# Patient Record
Sex: Female | Born: 1970 | Race: White | Hispanic: No | Marital: Married | Smoking: Former smoker
Health system: Southern US, Community
[De-identification: ages and names within clinical notes are randomized; demographics above are authoritative.]

## PROBLEM LIST (undated history)

## (undated) DIAGNOSIS — D649 Anemia, unspecified: Secondary | ICD-10-CM

## (undated) DIAGNOSIS — R000 Tachycardia, unspecified: Secondary | ICD-10-CM

## (undated) DIAGNOSIS — Z87898 Personal history of other specified conditions: Secondary | ICD-10-CM

## (undated) DIAGNOSIS — Z8739 Personal history of other diseases of the musculoskeletal system and connective tissue: Secondary | ICD-10-CM

## (undated) DIAGNOSIS — L900 Lichen sclerosus et atrophicus: Secondary | ICD-10-CM

## (undated) DIAGNOSIS — G243 Spasmodic torticollis: Secondary | ICD-10-CM

## (undated) DIAGNOSIS — Z8041 Family history of malignant neoplasm of ovary: Secondary | ICD-10-CM

## (undated) DIAGNOSIS — K5792 Diverticulitis of intestine, part unspecified, without perforation or abscess without bleeding: Secondary | ICD-10-CM

## (undated) DIAGNOSIS — M436 Torticollis: Secondary | ICD-10-CM

## (undated) DIAGNOSIS — R87810 Cervical high risk human papillomavirus (HPV) DNA test positive: Secondary | ICD-10-CM

## (undated) DIAGNOSIS — I10 Essential (primary) hypertension: Secondary | ICD-10-CM

## (undated) HISTORY — DX: Spasmodic torticollis: G24.3

## (undated) HISTORY — DX: Personal history of other diseases of the musculoskeletal system and connective tissue: Z87.39

## (undated) HISTORY — DX: Personal history of other specified conditions: Z87.898

## (undated) HISTORY — DX: Lichen sclerosus et atrophicus: L90.0

## (undated) HISTORY — DX: Diverticulitis of intestine, part unspecified, without perforation or abscess without bleeding: K57.92

## (undated) HISTORY — DX: Family history of malignant neoplasm of ovary: Z80.41

## (undated) HISTORY — DX: Torticollis: M43.6

## (undated) HISTORY — DX: Cervical high risk human papillomavirus (HPV) DNA test positive: R87.810

## (undated) HISTORY — DX: Tachycardia, unspecified: R00.0

## (undated) HISTORY — DX: Anemia, unspecified: D64.9

## (undated) HISTORY — PX: TONSILLECTOMY: SUR1361

---

## 1996-12-11 HISTORY — PX: CERVICAL BIOPSY  W/ LOOP ELECTRODE EXCISION: SUR135

## 1997-06-18 HISTORY — PX: VULVA /PERINEUM BIOPSY: SHX319

## 2001-06-18 DIAGNOSIS — R87810 Cervical high risk human papillomavirus (HPV) DNA test positive: Secondary | ICD-10-CM

## 2001-06-18 HISTORY — DX: Cervical high risk human papillomavirus (HPV) DNA test positive: R87.810

## 2002-06-18 HISTORY — PX: CHOLECYSTECTOMY: SHX55

## 2004-10-11 ENCOUNTER — Ambulatory Visit: Payer: Self-pay | Admitting: Unknown Physician Specialty

## 2004-11-10 ENCOUNTER — Ambulatory Visit: Payer: Self-pay | Admitting: Emergency Medicine

## 2004-11-10 ENCOUNTER — Emergency Department: Payer: Self-pay | Admitting: Emergency Medicine

## 2005-06-18 HISTORY — PX: COLONOSCOPY: SHX5424

## 2007-01-14 ENCOUNTER — Emergency Department: Payer: Self-pay

## 2007-01-29 ENCOUNTER — Ambulatory Visit: Payer: Self-pay | Admitting: Unknown Physician Specialty

## 2007-02-14 ENCOUNTER — Ambulatory Visit: Payer: Self-pay | Admitting: Unknown Physician Specialty

## 2007-02-26 ENCOUNTER — Ambulatory Visit: Payer: Self-pay | Admitting: Unknown Physician Specialty

## 2007-03-20 ENCOUNTER — Ambulatory Visit: Payer: Self-pay | Admitting: Unknown Physician Specialty

## 2009-03-29 ENCOUNTER — Ambulatory Visit: Payer: Self-pay | Admitting: Unknown Physician Specialty

## 2009-03-31 ENCOUNTER — Ambulatory Visit: Payer: Self-pay | Admitting: Unknown Physician Specialty

## 2009-06-18 HISTORY — PX: BREAST BIOPSY: SHX20

## 2009-08-31 ENCOUNTER — Emergency Department: Payer: Self-pay | Admitting: Emergency Medicine

## 2009-10-04 ENCOUNTER — Ambulatory Visit: Payer: Self-pay | Admitting: General Surgery

## 2010-05-08 ENCOUNTER — Ambulatory Visit: Payer: Self-pay | Admitting: General Surgery

## 2011-06-13 ENCOUNTER — Ambulatory Visit: Payer: Self-pay | Admitting: Unknown Physician Specialty

## 2012-06-18 DIAGNOSIS — R000 Tachycardia, unspecified: Secondary | ICD-10-CM

## 2012-06-18 HISTORY — DX: Tachycardia, unspecified: R00.0

## 2013-02-06 ENCOUNTER — Ambulatory Visit: Payer: Self-pay | Admitting: Neurology

## 2013-02-12 ENCOUNTER — Emergency Department: Payer: Self-pay | Admitting: Emergency Medicine

## 2013-02-12 LAB — DRUG SCREEN, URINE

## 2013-02-12 LAB — CBC
HCT: 39.1 % (ref 35.0–47.0)
MCH: 31.4 pg (ref 26.0–34.0)
MCHC: 35 g/dL (ref 32.0–36.0)
Platelet: 198 10*3/uL (ref 150–440)
RBC: 4.36 10*6/uL (ref 3.80–5.20)
RDW: 13.2 % (ref 11.5–14.5)

## 2013-02-12 LAB — URINALYSIS, COMPLETE
Bacteria: NONE SEEN
Bilirubin,UR: NEGATIVE
Glucose,UR: NEGATIVE mg/dL (ref 0–75)
Ketone: NEGATIVE
Leukocyte Esterase: NEGATIVE
Nitrite: NEGATIVE
Ph: 5 (ref 4.5–8.0)
Protein: NEGATIVE
Squamous Epithelial: NONE SEEN
WBC UR: <1 /HPF (ref 0–5)

## 2013-02-12 LAB — TROPONIN I: Troponin-I: <0.02 ng/mL

## 2013-02-12 LAB — COMPREHENSIVE METABOLIC PANEL
Albumin: 3.9 g/dL (ref 3.4–5.0)
Alkaline Phosphatase: 100 U/L (ref 50–136)
Anion Gap: 7 (ref 7–16)
BUN: 10 mg/dL (ref 7–18)
Chloride: 109 mmol/L — ABNORMAL HIGH (ref 98–107)
EGFR (African American): >60
EGFR (Non-African Amer.): >60
Osmolality: 273 (ref 275–301)
SGOT(AST): 21 U/L (ref 15–37)
SGPT (ALT): 18 U/L (ref 12–78)
Total Protein: 6.8 g/dL (ref 6.4–8.2)

## 2013-02-12 LAB — CK TOTAL AND CKMB (NOT AT ARMC): CK-MB: 0.9 ng/mL (ref 0.5–3.6)

## 2013-02-13 LAB — TSH: Thyroid Stimulating Horm: 1.64 u[IU]/mL

## 2013-03-19 ENCOUNTER — Emergency Department: Payer: Self-pay | Admitting: Emergency Medicine

## 2013-03-19 LAB — COMPREHENSIVE METABOLIC PANEL
Albumin: 4.5 g/dL (ref 3.4–5.0)
Alkaline Phosphatase: 108 U/L (ref 50–136)
BUN: 13 mg/dL (ref 7–18)
Calcium, Total: 9.1 mg/dL (ref 8.5–10.1)
Chloride: 104 mmol/L (ref 98–107)
Creatinine: 0.9 mg/dL (ref 0.60–1.30)
EGFR (African American): >60
SGOT(AST): 25 U/L (ref 15–37)
SGPT (ALT): 23 U/L (ref 12–78)
Total Protein: 7.5 g/dL (ref 6.4–8.2)

## 2013-03-19 LAB — CBC WITH DIFFERENTIAL/PLATELET
Basophil #: 0.1 10*3/uL (ref 0.0–0.1)
Basophil %: 1.2 %
Eosinophil #: 0 10*3/uL (ref 0.0–0.7)
HCT: 42.1 % (ref 35.0–47.0)
Monocyte %: 5.1 %
Neutrophil #: 4.2 10*3/uL (ref 1.4–6.5)
Platelet: 225 10*3/uL (ref 150–440)
RBC: 4.69 10*6/uL (ref 3.80–5.20)
WBC: 6.7 10*3/uL (ref 3.6–11.0)

## 2013-03-19 LAB — TROPONIN I: Troponin-I: <0.02 ng/mL

## 2014-05-26 ENCOUNTER — Emergency Department: Payer: Self-pay | Admitting: Emergency Medicine

## 2014-11-12 ENCOUNTER — Other Ambulatory Visit: Payer: Self-pay | Admitting: Orthopedic Surgery

## 2014-11-12 DIAGNOSIS — S63592A Other specified sprain of left wrist, initial encounter: Secondary | ICD-10-CM

## 2014-11-12 DIAGNOSIS — G8929 Other chronic pain: Secondary | ICD-10-CM

## 2014-11-12 DIAGNOSIS — M25532 Pain in left wrist: Secondary | ICD-10-CM

## 2014-11-22 ENCOUNTER — Ambulatory Visit: Payer: Self-pay

## 2014-11-24 ENCOUNTER — Ambulatory Visit
Admission: RE | Admit: 2014-11-24 | Discharge: 2014-11-24 | Disposition: A | Discharge status: Home / Self Care | Payer: BLUE CROSS/BLUE SHIELD | Source: Ambulatory Visit | Attending: Orthopedic Surgery | Admitting: Orthopedic Surgery

## 2014-11-24 DIAGNOSIS — S63592A Other specified sprain of left wrist, initial encounter: Secondary | ICD-10-CM

## 2014-11-24 DIAGNOSIS — M778 Other enthesopathies, not elsewhere classified: Secondary | ICD-10-CM | POA: Diagnosis not present

## 2014-11-24 DIAGNOSIS — M25532 Pain in left wrist: Secondary | ICD-10-CM

## 2014-11-24 DIAGNOSIS — G8929 Other chronic pain: Secondary | ICD-10-CM

## 2015-01-17 DIAGNOSIS — M436 Torticollis: Secondary | ICD-10-CM

## 2015-01-17 HISTORY — DX: Torticollis: M43.6

## 2015-12-17 HISTORY — PX: WRIST SURGERY: SHX841

## 2016-06-05 ENCOUNTER — Other Ambulatory Visit: Payer: Self-pay | Admitting: Certified Nurse Midwife

## 2016-06-05 DIAGNOSIS — Z1231 Encounter for screening mammogram for malignant neoplasm of breast: Secondary | ICD-10-CM

## 2016-06-18 HISTORY — PX: BREAST BIOPSY: SHX20

## 2016-06-22 ENCOUNTER — Encounter (HOSPITAL_COMMUNITY): Payer: Self-pay

## 2016-06-22 ENCOUNTER — Ambulatory Visit
Admission: RE | Admit: 2016-06-22 | Discharge: 2016-06-22 | Disposition: A | Discharge status: Home / Self Care | Payer: BLUE CROSS/BLUE SHIELD | Source: Ambulatory Visit | Attending: Certified Nurse Midwife | Admitting: Certified Nurse Midwife

## 2016-06-22 DIAGNOSIS — Z1231 Encounter for screening mammogram for malignant neoplasm of breast: Secondary | ICD-10-CM | POA: Insufficient documentation

## 2016-06-26 ENCOUNTER — Other Ambulatory Visit: Payer: Self-pay | Admitting: Certified Nurse Midwife

## 2016-06-26 DIAGNOSIS — R928 Other abnormal and inconclusive findings on diagnostic imaging of breast: Secondary | ICD-10-CM

## 2016-06-26 DIAGNOSIS — R921 Mammographic calcification found on diagnostic imaging of breast: Secondary | ICD-10-CM

## 2016-07-11 ENCOUNTER — Ambulatory Visit
Admission: RE | Admit: 2016-07-11 | Discharge: 2016-07-11 | Disposition: A | Discharge status: Home / Self Care | Payer: Self-pay | Source: Ambulatory Visit | Attending: *Deleted | Admitting: *Deleted

## 2016-07-11 ENCOUNTER — Ambulatory Visit
Admission: RE | Admit: 2016-07-11 | Discharge: 2016-07-11 | Disposition: A | Discharge status: Home / Self Care | Payer: BLUE CROSS/BLUE SHIELD | Source: Ambulatory Visit | Attending: Certified Nurse Midwife | Admitting: Certified Nurse Midwife

## 2016-07-11 ENCOUNTER — Other Ambulatory Visit: Payer: Self-pay | Admitting: *Deleted

## 2016-07-11 DIAGNOSIS — Z9289 Personal history of other medical treatment: Secondary | ICD-10-CM

## 2016-07-11 DIAGNOSIS — R928 Other abnormal and inconclusive findings on diagnostic imaging of breast: Secondary | ICD-10-CM

## 2016-07-11 DIAGNOSIS — R921 Mammographic calcification found on diagnostic imaging of breast: Secondary | ICD-10-CM | POA: Diagnosis present

## 2016-07-12 ENCOUNTER — Other Ambulatory Visit: Payer: Self-pay | Admitting: Certified Nurse Midwife

## 2016-07-12 DIAGNOSIS — R928 Other abnormal and inconclusive findings on diagnostic imaging of breast: Secondary | ICD-10-CM

## 2016-07-12 DIAGNOSIS — R921 Mammographic calcification found on diagnostic imaging of breast: Secondary | ICD-10-CM

## 2016-08-02 ENCOUNTER — Ambulatory Visit
Admission: RE | Admit: 2016-08-02 | Discharge: 2016-08-02 | Disposition: A | Discharge status: Home / Self Care | Payer: BLUE CROSS/BLUE SHIELD | Source: Ambulatory Visit | Attending: Certified Nurse Midwife | Admitting: Certified Nurse Midwife

## 2016-08-02 DIAGNOSIS — R921 Mammographic calcification found on diagnostic imaging of breast: Secondary | ICD-10-CM

## 2016-08-02 DIAGNOSIS — N6021 Fibroadenosis of right breast: Secondary | ICD-10-CM | POA: Insufficient documentation

## 2016-08-02 DIAGNOSIS — R928 Other abnormal and inconclusive findings on diagnostic imaging of breast: Secondary | ICD-10-CM

## 2016-08-03 LAB — SURGICAL PATHOLOGY

## 2016-08-07 ENCOUNTER — Encounter: Payer: Self-pay | Admitting: *Deleted

## 2016-08-23 ENCOUNTER — Ambulatory Visit (INDEPENDENT_AMBULATORY_CARE_PROVIDER_SITE_OTHER): Payer: BLUE CROSS/BLUE SHIELD | Admitting: General Surgery

## 2016-08-23 ENCOUNTER — Encounter: Payer: Self-pay | Admitting: General Surgery

## 2016-08-23 VITALS — BP 120/78 | HR 81 | Ht 67.0 in | Wt 151.0 lb

## 2016-08-23 DIAGNOSIS — N6489 Other specified disorders of breast: Secondary | ICD-10-CM | POA: Diagnosis not present

## 2016-08-23 NOTE — Patient Instructions (Addendum)
6 month follow up with right diagnostic mammogram.Take two Aleve twice a day, apply local heat to area.

## 2016-08-23 NOTE — Progress Notes (Signed)
Patient ID: Chelsea Charles, female   DOB: Mar 07, 1971, 46 y.o.   MRN: [id number]  Chief Complaint  Patient presents with  . Other    Complex sclerosis lesion    HPI Chelsea Charles is a 46 y.o. female is here today for an evaluation of a complex sclerosis lesion found on a biopsy done on 08/02/16. Patient had a mammogram done on 07/11/16. Patient reports she does not perform self breast checks as often as she should. She could not feel anything prior to having a mammogram. She does get her yearly mammograms completed. She states she had a biopsy done in 2011 near the same site as previously biopsied. She does report some tenderness in the area. Denies family history of breast cancer.  HPI  Past Medical History:  Diagnosis Date  . Tachycardia 2014  . Torticollis 01/2015    Past Surgical History:  Procedure Laterality Date  . BREAST BIOPSY Right 2011   stereo- neg-by Dr. Lemar Livings  . CESAREAN SECTION  2004  . CESAREAN SECTION  2002  . COLONOSCOPY  2007  . LEEP  1998  . WRIST SURGERY  12/2015    Family History  Problem Relation Age of Onset  . Breast cancer Cousin   . Cancer Maternal Grandmother     ovarian    Social History Social History  Substance Use Topics  . Smoking status: Former Smoker    Types: Cigarettes    Quit date: 06/19/1995  . Smokeless tobacco: Never Used  . Alcohol use Yes     Comment: occasionally    Allergies  Allergen Reactions  . Sulfa Antibiotics Shortness Of Breath  . Codeine Nausea And Vomiting  . Penicillins Swelling    Current Outpatient Prescriptions  Medication Sig Dispense Refill  . Ascorbic Acid (VITAMIN C) 1000 MG tablet Take 1,000 mg by mouth daily.    . fluticasone (FLONASE) 50 MCG/ACT nasal spray Place into the nose.    . ibuprofen (ADVIL,MOTRIN) 200 MG tablet Take by mouth.    . LO LOESTRIN FE 1 MG-10 MCG / 10 MCG tablet     . loratadine (CLARITIN) 10 MG tablet Take 10 mg by mouth daily.    . Multiple Vitamins-Minerals  (MULTIVITAMIN GUMMIES WOMENS PO) Take by mouth.     No current facility-administered medications for this visit.     Review of Systems Review of Systems  Constitutional: Negative.   Respiratory: Negative.   Cardiovascular: Negative.     Blood pressure 120/78, pulse 81, height 5\' 7"  (1.702 m), weight 151 lb (68.5 kg).  Physical Exam Physical Exam  Constitutional: She is oriented to person, place, and time. She appears well-developed and well-nourished.  Eyes: Conjunctivae are normal. No scleral icterus.  Neck: Neck supple. No thyromegaly present.  Cardiovascular: Normal rate, regular rhythm and normal heart sounds.   Pulmonary/Chest: Effort normal and breath sounds normal. Right breast exhibits no inverted nipple, no mass, no nipple discharge, no skin change and no tenderness. Left breast exhibits no inverted nipple, no mass, no nipple discharge, no skin change and no tenderness.  Minimal bruising from biopsy   Lymphadenopathy:    She has no cervical adenopathy.    She has no axillary adenopathy.  Neurological: She is alert and oriented to person, place, and time.  Skin: Skin is warm and dry.    Data Reviewed 2015 in 2018 mammograms were reviewed including both pre-and post biopsy imaging of the right breast. Extensive calcifications noted on both sets of films.  Essentially resolved post biopsy.  August 02, 2016 biopsy result:  A. BREAST CALCIFICATIONS, RIGHT UPPER OUTER QUADRANT; STEREOTACTIC  BIOPSY:  - COMPLEX SCLEROSING LESION.  - COLUMNAR CELL CHANGE.  - PSEUDO-ANGIOMATOUS STROMAL HYPERPLASIA.  - SCLEROSING ADENOSIS.  - CYST FORMATION.  - CALCIFICATIONS ASSOCIATED WITH ABOVE FINDINGS.  - NEGATIVE FOR ATYPIA AND MALIGNANCY.   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    Plan    A recent article from the age [facility] [employer] volume to 23, number 04/23/2015 describes  it radial scar does not require additional surgery and less atypia is indicated.  Position statement of the American Society [employer] Surgeons describes decision making on a case-by-case basis but leans towards excision.   Based on review of the pre-and post biopsy imaging as well as the 2015 films I believe that observation alone is warranted. Opportunity for biopsy if she be more comfortable was discussed.     At this time, the patient is comfortable with observation. Due to the residual soreness postbiopsy she's been encouraged to make use of nonsteroidals.  Take two Aleve twice a day, apply local heat to area.  This information has been scribed by Milas Kocherebeca Morris, CMA     Earline MayotteByrnett, Montel Vanderhoof W 08/24/2016, 7:02 AM

## 2016-08-24 DIAGNOSIS — N6489 Other specified disorders of breast: Secondary | ICD-10-CM | POA: Insufficient documentation

## 2016-11-28 ENCOUNTER — Other Ambulatory Visit: Payer: Self-pay

## 2016-11-28 DIAGNOSIS — N6489 Other specified disorders of breast: Secondary | ICD-10-CM

## 2017-01-31 ENCOUNTER — Other Ambulatory Visit: Payer: BLUE CROSS/BLUE SHIELD

## 2017-02-01 ENCOUNTER — Ambulatory Visit
Admission: RE | Admit: 2017-02-01 | Discharge: 2017-02-01 | Disposition: A | Discharge status: Home / Self Care | Payer: BLUE CROSS/BLUE SHIELD | Source: Ambulatory Visit | Attending: General Surgery | Admitting: General Surgery

## 2017-02-01 DIAGNOSIS — N6489 Other specified disorders of breast: Secondary | ICD-10-CM

## 2017-02-05 ENCOUNTER — Ambulatory Visit: Payer: BLUE CROSS/BLUE SHIELD | Admitting: General Surgery

## 2017-02-07 ENCOUNTER — Ambulatory Visit (INDEPENDENT_AMBULATORY_CARE_PROVIDER_SITE_OTHER): Payer: BLUE CROSS/BLUE SHIELD | Admitting: General Surgery

## 2017-02-07 ENCOUNTER — Encounter: Payer: Self-pay | Admitting: General Surgery

## 2017-02-07 VITALS — BP 132/72 | HR 74 | Resp 12 | Ht 63.0 in | Wt 169.0 lb

## 2017-02-07 DIAGNOSIS — N6489 Other specified disorders of breast: Secondary | ICD-10-CM | POA: Diagnosis not present

## 2017-02-07 NOTE — Progress Notes (Signed)
Patient ID: Chelsea Charles, female   DOB: [birth year], 46 y.o.   MRN: [id number]  Chief Complaint  Patient presents with  . Follow-up    HPI Chelsea Charles is a 46 y.o. female who presents for a breast evaluation. The most recent right breast mammogram was done on 02/01/2017.  Marland Kitchen  Patient does perform regular self breast checks and gets regular mammograms done.    HPI  Past Medical History:  Diagnosis Date  . Tachycardia 2014  . Torticollis 01/2015    Past Surgical History:  Procedure Laterality Date  . BREAST BIOPSY Right 2011   stereo- neg-by Dr. Lemar Livings  . CESAREAN SECTION  2004  . CESAREAN SECTION  2002  . COLONOSCOPY  2007  . LEEP  1998  . WRIST SURGERY  12/2015    Family History  Problem Relation Age of Onset  . Breast cancer Cousin   . Cancer Maternal Grandmother        ovarian    Social History Social History  Substance Use Topics  . Smoking status: Former Smoker    Types: Cigarettes    Quit date: 06/19/1995  . Smokeless tobacco: Never Used  . Alcohol use Yes     Comment: occasionally    Allergies  Allergen Reactions  . Sulfa Antibiotics Shortness Of Breath  . Codeine Nausea And Vomiting  . Penicillins Swelling    Current Outpatient Prescriptions  Medication Sig Dispense Refill  . cyanocobalamin 1000 MCG tablet Take 1,000 mcg by mouth daily.    . fluticasone (FLONASE) 50 MCG/ACT nasal spray Place into the nose.    . ibuprofen (ADVIL,MOTRIN) 200 MG tablet Take by mouth.    . LO LOESTRIN FE 1 MG-10 MCG / 10 MCG tablet     . loratadine (CLARITIN) 10 MG tablet Take 10 mg by mouth daily as needed.     . Multiple Vitamins-Minerals (MULTIVITAMIN GUMMIES WOMENS PO) Take by mouth.     No current facility-administered medications for this visit.     Review of Systems Review of Systems  Constitutional: Negative.   Respiratory: Negative.   Cardiovascular: Negative.     Blood pressure 132/72, pulse 74, resp. rate 12, height 5\' 3"  (1.6 m), weight 169  lb (76.7 kg).  Physical Exam Physical Exam  Constitutional: She is oriented to person, place, and time. She appears well-developed and well-nourished.  Eyes: Conjunctivae are normal. No scleral icterus.  Neck: Neck supple.  Cardiovascular: Normal rate, regular rhythm and normal heart sounds.   Pulmonary/Chest: Effort normal and breath sounds normal. Right breast exhibits no inverted nipple, no mass, no nipple discharge, no skin change and no tenderness. Left breast exhibits no inverted nipple, no mass, no nipple discharge, no skin change and no tenderness.  Abdominal: Soft. Bowel sounds are normal. There is no tenderness.  Lymphadenopathy:    She has no cervical adenopathy.    She has no axillary adenopathy.  Neurological: She is alert and oriented to person, place, and time.  Skin: Skin is warm and dry.    Data Reviewed Interval six-month follow-up right diagnostic mammogram dated 02/01/2017 was reviewed. No interval change in the remaining breast parenchyma. No new areas of architectural distortion.  August 02, 2016 biopsy result:  A. BREAST CALCIFICATIONS, RIGHT UPPER OUTER QUADRANT; STEREOTACTIC  BIOPSY:  - COMPLEX SCLEROSING LESION.  - COLUMNAR CELL CHANGE.  - PSEUDO-ANGIOMATOUS STROMAL HYPERPLASIA.  - SCLEROSING ADENOSIS.  - CYST FORMATION.  - CALCIFICATIONS ASSOCIATED WITH ABOVE FINDINGS.  - NEGATIVE FOR  ATYPIA AND MALIGNANCY.   Assessment    Stable mammogram.    Plan    Options for management reviewed: 1) surgical excision versus 2) sees ongoing observation. As previously referenced JACS Vol 23, reported observation as acceptable care.      Patient to return in six months bilateral diagnotic mammogram. The patient is aware to call back for any questions or concerns.   HPI, Physical Exam, Assessment and Plan have been scribed under the direction and in the presence of Donnalee Curry, MD.  Ples Specter, CMA  I have completed the exam and reviewed the  above documentation for accuracy and completeness.  I agree with the above.  Museum/gallery conservator has been used and any errors in dictation or transcription are unintentional.  Donnalee Curry, M.D., F.A.C.S.  Chelsea Charles 02/08/2017, 4:56 PM

## 2017-04-08 ENCOUNTER — Other Ambulatory Visit: Payer: Self-pay | Admitting: Certified Nurse Midwife

## 2017-05-02 ENCOUNTER — Other Ambulatory Visit: Payer: Self-pay

## 2017-05-02 DIAGNOSIS — N6489 Other specified disorders of breast: Secondary | ICD-10-CM

## 2017-05-17 ENCOUNTER — Encounter: Payer: Self-pay | Admitting: Certified Nurse Midwife

## 2017-05-17 ENCOUNTER — Ambulatory Visit (INDEPENDENT_AMBULATORY_CARE_PROVIDER_SITE_OTHER): Payer: BLUE CROSS/BLUE SHIELD | Admitting: Certified Nurse Midwife

## 2017-05-17 VITALS — BP 120/76 | HR 114 | Ht 65.0 in | Wt 165.0 lb

## 2017-05-17 DIAGNOSIS — Z124 Encounter for screening for malignant neoplasm of cervix: Secondary | ICD-10-CM

## 2017-05-17 DIAGNOSIS — Z1211 Encounter for screening for malignant neoplasm of colon: Secondary | ICD-10-CM

## 2017-05-17 DIAGNOSIS — Z01419 Encounter for gynecological examination (general) (routine) without abnormal findings: Secondary | ICD-10-CM

## 2017-05-17 DIAGNOSIS — R000 Tachycardia, unspecified: Secondary | ICD-10-CM | POA: Insufficient documentation

## 2017-05-17 MED ORDER — NORETHIN-ETH ESTRAD-FE BIPHAS 1 MG-10 MCG / 10 MCG PO TABS: 1.0000 | ORAL_TABLET | Freq: Every day | ORAL | 11 refills | Status: DC

## 2017-05-17 NOTE — Progress Notes (Signed)
Gynecology Annual Exam  PCP: Mick SellFitzgerald, David P, MD  Chief Complaint:  Chief Complaint  Patient presents with  . Gynecologic Exam    History of Present Illness: Chelsea Charles is a 46 y.o. WF, G2P2002, who  presents for her annual exam. She has no significant gyn complaints today.  Her menses are irregular and infrequent on the Lo Loestrin.  She had a withdrawal bleed in July and another  October 31 lasting 2-3 days and with light flow/spotting. . She does not have breakthrough  bleeding.  She denies dysmenorrhea.She does have night sweats and hot flashes. Last pap smear: 05/09/2016, results were NIL/neg HRHPV. She has a remote history of abnormal Pap smears and had a LEEP in 1998   The patient is sexually active. She currently uses BCPs for contraception.   Since her last visit, she has had a stereotactic biopsy on her lright breast for suspicious calcifications. The biopsy returned complex sclerosing lesion. Dr Lemar LivingsByrnett was consulted and mutual decision was made to follow with mammograms.  Her past medical history is remarkable for torticollis, SVT and in 2017 she had a corrective osteotomy of her left wrist.  The patient does perform self breast exams. Her last bilateral mammogram/ additional views were done 06/22/2016 and 07/11/2016 , results were Birads 4. She had a right breast mammogram after her biopsy on 02/01/2017 that was negative. Next bilateral mammogram is due 07/2017.Marland Kitchen.   There is a family history of breast cancer in her paternal cousin. Genetic testing has not been done.  THere is a family history of ovarian cancer in her paternal grandmother.Genetic testing has not been done.  The patient denies smoking.  She denies drinking.  She denies illegal drug use.  The patient exercises by walking the dog..  The patient denies current symptoms of depression.    Review of Systems: Review of Systems  Constitutional: Negative for chills, fever and weight loss.  HENT:  Positive for congestion and sinus pain. Negative for sore throat.   Eyes: Negative for blurred vision and pain.  Respiratory: Positive for cough. Negative for hemoptysis, shortness of breath and wheezing.   Cardiovascular: Negative for chest pain, palpitations and leg swelling.  Gastrointestinal: Negative for abdominal pain, blood in stool, diarrhea, heartburn, nausea and vomiting.  Genitourinary: Negative for dysuria, frequency, hematuria and urgency.  Musculoskeletal: Negative for back pain, joint pain and myalgias.  Skin: Negative for itching and rash.  Neurological: Negative for dizziness, tingling and headaches.  Endo/Heme/Allergies: Positive for environmental allergies. Negative for polydipsia. Does not bruise/bleed easily.       Negative for hirsutism. POsitive for hot flashes and night sweats   Psychiatric/Behavioral: Negative for depression. The patient is not nervous/anxious and does not have insomnia.     Past Medical History:  Past Medical History:  Diagnosis Date  . Anemia   . Cervical dystonia   . History of abnormal mammogram   . Hx of degenerative disc disease   . Tachycardia 2014  . Torticollis 01/2015    Past Surgical History:  Past Surgical History:  Procedure Laterality Date  . BREAST BIOPSY Right 2011   stereo- neg-by Dr. Lemar LivingsByrnett  . CERVICAL BIOPSY  W/ LOOP ELECTRODE EXCISION  12/11/1996  . CESAREAN SECTION  2004  . CESAREAN SECTION  2002  . CHOLECYSTECTOMY    . COLONOSCOPY  2007  . TONSILLECTOMY    . VULVA /PERINEUM BIOPSY  1999  . WRIST SURGERY Left 12/2015   corrective osteotomy  Family History:  Family History  Problem Relation Age of Onset  . Breast cancer Cousin        paternal second cousin  . Heart disease Mother   . Hypertension Mother   . Heart disease Father   . Hypertension Father   . Colon polyps Father   . Cancer Maternal Grandmother        ovarian    Social History:  Social History   Socioeconomic History  . Marital  status: Married    Spouse name: Not on file  . Number of children: 2  . Years of education: Not on file  . Highest education level: Not on file  Social Needs  . Financial resource strain: Not on file  . Food insecurity - worry: Not on file  . Food insecurity - inability: Not on file  . Transportation needs - medical: Not on file  . Transportation needs - non-medical: Not on file  Occupational History  . Not on file  Tobacco Use  . Smoking status: Former Smoker    Types: Cigarettes    Last attempt to quit: 06/19/1995    Years since quitting: 21.9  . Smokeless tobacco: Never Used  Substance and Sexual Activity  . Alcohol use: Yes    Comment: occasionally  . Drug use: No  . Sexual activity: Yes    Partners: Male    Birth control/protection: Pill  Other Topics Concern  . Not on file  Social History Narrative  . Not on file    Allergies:  Allergies  Allergen Reactions  . Sulfa Antibiotics Shortness Of Breath and Other (See Comments)    Elevated heart rate  . Codeine Nausea And Vomiting and Nausea Only  . Penicillins Swelling and Other (See Comments)    Childhood reaction, facial swelling  . Doxycycline Palpitations    Medications: Prior to Admission medications   Medication Sig Start Date End Date Taking? Authorizing Provider  cyanocobalamin 1000 MCG tablet Take 1,000 mcg by mouth daily.   Yes [provider]  FLUCELVAX QUADRIVALENT 0.5 ML SUSY TO BE ADMINISTERED BY PHARMACIST FOR IMMUNIZATION 04/06/17  Yes [provider]  fluticasone (FLONASE) 50 MCG/ACT nasal spray Place into the nose. 05/14/16  Yes [provider]  ibuprofen (ADVIL,MOTRIN) 200 MG tablet Take by mouth.   Yes [provider]  LO LOESTRIN FE 1 MG-10 MCG / 10 MCG tablet TAKE 1 TABLET BY MOUTH EVERY DAY. 04/08/17  Yes Farrel ConnersGutierrez, Gaynelle Pastrana, CNM  loratadine (CLARITIN) 10 MG tablet Take 10 mg by mouth daily as needed.    Yes [provider]  Multiple  Vitamins-Minerals (MULTIVITAMIN GUMMIES WOMENS PO) Take by mouth.   Yes [provider]    Physical Exam Vitals: BP 120/76   Pulse (!) 114   Ht 5\' 5"  (1.651 m)   Wt 165 lb (74.8 kg)   LMP 04/17/2017 (Exact Date)   BMI 27.46 kg/m   General: WF in  NAD HEENT: normocephalic, anicteric Neck: no thyroid enlargement, no palpable nodules, no cervical lymphadenopathy   Neck twists to the left Pulmonary: No increased work of breathing, CTAB Cardiovascular: RRR, without murmur  Breast: Breast symmetrical, no tenderness, no palpable nodules or masses, no skin or nipple retraction present, no nipple discharge.  No axillary, infraclavicular or supraclavicular lymphadenopathy. Abdomen: Soft, non-tender, non-distended.  Umbilicus without lesions.  No hepatomegaly or masses palpable. No evidence of hernia. Genitourinary:  External: Normal external female genitalia.  Normal urethral meatus, normal Bartholin's and Skene's glands.  Vagina: Normal vaginal mucosa, no evidence of prolapse.    Cervix: Grossly normal in appearance, no bleeding, non-tender  Uterus: Retroflexed, normal size, shape, and consistency, immobile, and non-tender  Adnexa: No adnexal masses, non-tender  Rectal: deferred  Lymphatic: no evidence of inguinal lymphadenopathy Extremities: no edema, erythema, or tenderness Neurologic: Grossly intact Psychiatric: mood appropriate, affect full     Assessment: 46 y.o. [id number] annual gyn exam Perimenopausal symptoms Irregular and infrequent withdrawal on BCPs   Plan:   1) Breast cancer screening - recommend monthly self breast exam and bilateral mammogram and follow up per Dr Lemar Livings.   2) Cervical cancer screening - Pap was done.  Patient opts for yearly screening interval  3) Contraception - Refill Lo Loestrin  4) Routine healthcare maintenance including cholesterol and diabetes screening managed by PCP (normal in 2017)  5) Colon cancer screen-discussed recent ACS  recommendations to screen for colon cancer at age 65. Desires to do home FIT test. Given packet and instructions on how to collect.  6) RTO 1 year and prn  Farrel Conners, CNM

## 2017-05-18 DIAGNOSIS — Z8041 Family history of malignant neoplasm of ovary: Secondary | ICD-10-CM

## 2017-05-18 HISTORY — DX: Family history of malignant neoplasm of ovary: Z80.41

## 2017-05-21 LAB — IGP,RFX APTIMA HPV ALL PTH: PAP Smear Comment: 0

## 2017-06-04 ENCOUNTER — Encounter: Payer: Self-pay | Admitting: Obstetrics and Gynecology

## 2017-06-18 DIAGNOSIS — L900 Lichen sclerosus et atrophicus: Secondary | ICD-10-CM

## 2017-06-18 DIAGNOSIS — K5792 Diverticulitis of intestine, part unspecified, without perforation or abscess without bleeding: Secondary | ICD-10-CM

## 2017-06-18 HISTORY — DX: Diverticulitis of intestine, part unspecified, without perforation or abscess without bleeding: K57.92

## 2017-06-18 HISTORY — DX: Lichen sclerosus et atrophicus: L90.0

## 2017-06-27 ENCOUNTER — Emergency Department
Admission: EM | Admit: 2017-06-27 | Discharge: 2017-06-27 | Disposition: A | Discharge status: Home / Self Care | Payer: BLUE CROSS/BLUE SHIELD | Attending: Emergency Medicine | Admitting: Emergency Medicine

## 2017-06-27 ENCOUNTER — Emergency Department: Payer: BLUE CROSS/BLUE SHIELD

## 2017-06-27 ENCOUNTER — Encounter: Payer: Self-pay | Admitting: Emergency Medicine

## 2017-06-27 ENCOUNTER — Other Ambulatory Visit: Payer: Self-pay

## 2017-06-27 DIAGNOSIS — Z9049 Acquired absence of other specified parts of digestive tract: Secondary | ICD-10-CM | POA: Insufficient documentation

## 2017-06-27 DIAGNOSIS — K5732 Diverticulitis of large intestine without perforation or abscess without bleeding: Secondary | ICD-10-CM | POA: Insufficient documentation

## 2017-06-27 DIAGNOSIS — R1032 Left lower quadrant pain: Secondary | ICD-10-CM | POA: Diagnosis present

## 2017-06-27 DIAGNOSIS — Z87891 Personal history of nicotine dependence: Secondary | ICD-10-CM | POA: Diagnosis not present

## 2017-06-27 DIAGNOSIS — Z791 Long term (current) use of non-steroidal anti-inflammatories (NSAID): Secondary | ICD-10-CM | POA: Insufficient documentation

## 2017-06-27 DIAGNOSIS — Z79899 Other long term (current) drug therapy: Secondary | ICD-10-CM | POA: Insufficient documentation

## 2017-06-27 DIAGNOSIS — K5792 Diverticulitis of intestine, part unspecified, without perforation or abscess without bleeding: Secondary | ICD-10-CM

## 2017-06-27 LAB — URINALYSIS, COMPLETE (UACMP) WITH MICROSCOPIC
BILIRUBIN URINE: NEGATIVE
Glucose, UA: NEGATIVE mg/dL
KETONES UR: 15 mg/dL — AB
NITRITE: NEGATIVE
Protein, ur: NEGATIVE mg/dL
SPECIFIC GRAVITY, URINE: 1.02 (ref 1.005–1.030)
pH: 5.5 (ref 5.0–8.0)

## 2017-06-27 LAB — COMPREHENSIVE METABOLIC PANEL
ALBUMIN: 4.7 g/dL (ref 3.5–5.0)
ALT: 17 U/L (ref 14–54)
ANION GAP: 11 (ref 5–15)
AST: 26 U/L (ref 15–41)
Alkaline Phosphatase: 106 U/L (ref 38–126)
BILIRUBIN TOTAL: 0.9 mg/dL (ref 0.3–1.2)
BUN: 11 mg/dL (ref 6–20)
CO2: 22 mmol/L (ref 22–32)
Calcium: 9.1 mg/dL (ref 8.9–10.3)
Chloride: 104 mmol/L (ref 101–111)
Creatinine, Ser: 0.79 mg/dL (ref 0.44–1.00)
GFR calc Af Amer: >60 mL/min (ref >60)
Glucose, Bld: 104 mg/dL — ABNORMAL HIGH (ref 65–99)
POTASSIUM: 3.3 mmol/L — AB (ref 3.5–5.1)
Sodium: 137 mmol/L (ref 135–145)
TOTAL PROTEIN: 7.8 g/dL (ref 6.5–8.1)

## 2017-06-27 LAB — CBC
HEMATOCRIT: 43.1 % (ref 35.0–47.0)
HEMOGLOBIN: 14.8 g/dL (ref 12.0–16.0)
MCH: 31.7 pg (ref 26.0–34.0)
MCHC: 34.4 g/dL (ref 32.0–36.0)
MCV: 92.1 fL (ref 80.0–100.0)
Platelets: 251 10*3/uL (ref 150–440)
RBC: 4.68 MIL/uL (ref 3.80–5.20)
RDW: 13.6 % (ref 11.5–14.5)
WBC: 8.5 10*3/uL (ref 3.6–11.0)

## 2017-06-27 LAB — LIPASE, BLOOD: LIPASE: 30 U/L (ref 11–51)

## 2017-06-27 LAB — POCT PREGNANCY, URINE: PREG TEST UR: NEGATIVE

## 2017-06-27 MED ORDER — METRONIDAZOLE 500 MG PO TABS: 500.0000 mg | ORAL_TABLET | Freq: Three times a day (TID) | ORAL | 0 refills | Status: AC

## 2017-06-27 MED ORDER — MORPHINE SULFATE (PF) 4 MG/ML IV SOLN
INTRAVENOUS | Status: AC
  Administered 2017-06-27: 2 mg
  Filled 2017-06-27: qty 1

## 2017-06-27 MED ORDER — METRONIDAZOLE 500 MG PO TABS
500.0000 mg | ORAL_TABLET | Freq: Once | ORAL | Status: AC
  Administered 2017-06-27: 500 mg via ORAL
  Filled 2017-06-27: qty 1

## 2017-06-27 MED ORDER — ONDANSETRON HCL 4 MG/2ML IJ SOLN
INTRAMUSCULAR | Status: AC
  Administered 2017-06-27: 4 mg via INTRAVENOUS
  Filled 2017-06-27: qty 2

## 2017-06-27 MED ORDER — MORPHINE SULFATE (PF) 2 MG/ML IV SOLN: 2.0000 mg | Freq: Once | INTRAVENOUS | Status: DC

## 2017-06-27 MED ORDER — SODIUM CHLORIDE 0.9 % IV BOLUS (SEPSIS)
1000.0000 mL | Freq: Once | INTRAVENOUS | Status: AC
  Administered 2017-06-27: 1000 mL via INTRAVENOUS

## 2017-06-27 MED ORDER — CIPROFLOXACIN HCL 500 MG PO TABS: 500.0000 mg | ORAL_TABLET | Freq: Two times a day (BID) | ORAL | 0 refills | Status: AC

## 2017-06-27 MED ORDER — ONDANSETRON HCL 4 MG/2ML IJ SOLN
4.0000 mg | Freq: Once | INTRAMUSCULAR | Status: AC
  Administered 2017-06-27: 4 mg via INTRAVENOUS

## 2017-06-27 MED ORDER — CIPROFLOXACIN HCL 500 MG PO TABS
500.0000 mg | ORAL_TABLET | Freq: Once | ORAL | Status: AC
  Administered 2017-06-27: 500 mg via ORAL
  Filled 2017-06-27: qty 1

## 2017-06-27 MED ORDER — IOPAMIDOL (ISOVUE-300) INJECTION 61%
100.0000 mL | Freq: Once | INTRAVENOUS | Status: AC | PRN
  Administered 2017-06-27: 100 mL via INTRAVENOUS
  Filled 2017-06-27: qty 100

## 2017-06-27 NOTE — ED Provider Notes (Signed)
[facility] Emergency Department Provider Note  ____________________________________________   First MD Initiated Contact with Patient 06/27/17 1930     (approximate)  I have reviewed the triage vital signs and the nursing notes.   HISTORY  Chief Complaint Abdominal Pain   HPI Chelsea Charles is a 47 y.o. female with a history of sinus tachycardia who is feeling with left lower quadrant abdominal pain over the past 5 days.  Says the pain started suddenly this past Sunday while she was in the supermarket.  Says the pain is since been intermittent and then she had 4 episodes of diarrhea today.  Denies any nausea or vomiting.  Says that she has had some radiation of the pain to her left flank as well as to the right side of the abdomen.  Has a family history of kidney stones as well as diverticulitis.  Denies any blood in the stool.  Says the pain is a 7 out of 10 at this time.  Past Medical History:  Diagnosis Date  . Anemia   . Cervical dystonia   . Family history of ovarian cancer 05/2017   genetic testing letter sent  . History of abnormal mammogram   . Hx of degenerative disc disease   . Tachycardia 2014  . Torticollis 01/2015    Patient Active Problem List   Diagnosis Date Noted  . Sinus tachycardia 05/17/2017  . 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 Torticollis 01/17/2015    Past Surgical History:  Procedure Laterality Date  . BREAST BIOPSY Right 2011   stereo- neg-by Dr. Lemar Livings  . CERVICAL BIOPSY  W/ LOOP ELECTRODE EXCISION  12/11/1996  . CESAREAN SECTION  2004  . CESAREAN SECTION  2002  . CHOLECYSTECTOMY    . COLONOSCOPY  2007  . TONSILLECTOMY    . VULVA /PERINEUM BIOPSY  1999  . WRIST SURGERY Left 12/2015   corrective osteotomy    Prior to Admission medications   Medication Sig Start Date End Date Taking? Authorizing Provider  cyanocobalamin 1000 MCG tablet Take 1,000 mcg by mouth daily.    [provider]  FLUCELVAX  QUADRIVALENT 0.5 ML SUSY TO BE ADMINISTERED BY PHARMACIST FOR IMMUNIZATION 04/06/17   [provider]  fluticasone (FLONASE) 50 MCG/ACT nasal spray Place into the nose. 05/14/16   [provider]  ibuprofen (ADVIL,MOTRIN) 200 MG tablet Take by mouth.    [provider]  loratadine (CLARITIN) 10 MG tablet Take 10 mg by mouth daily as needed.     [provider]  Multiple Vitamins-Minerals (MULTIVITAMIN GUMMIES WOMENS PO) Take by mouth.    [provider]  Norethindrone-Ethinyl Estradiol-Fe Biphas (LO LOESTRIN FE) 1 MG-10 MCG / 10 MCG tablet Take 1 tablet by mouth daily. 05/17/17   Farrel Conners, CNM    Allergies Sulfa antibiotics; Codeine; Penicillins; and Doxycycline  Family History  Problem Relation Age of Onset  . Breast cancer Cousin 69       paternal second cousin  . Heart disease Mother   . Hypertension Mother   . Heart disease Father   . Hypertension Father   . Colon polyps Father   . Ovarian cancer Maternal Grandmother 42    Social History Social History   Tobacco Use  . Smoking status: Former Smoker    Types: Cigarettes    Last attempt to quit: 06/19/1995    Years since quitting: 22.0  . Smokeless tobacco: Never Used  Substance Use Topics  . Alcohol use:  Yes    Comment: occasionally  . Drug use: No    Review of Systems  Constitutional: No fever/chills Eyes: No visual changes. ENT: No sore throat. Cardiovascular: Denies chest pain. Respiratory: Denies shortness of breath. Gastrointestinal:   No nausea, no vomiting.   No constipation. Genitourinary: Negative for dysuria. Musculoskeletal: Negative for back pain. Skin: Negative for rash. Neurological: Negative for headaches, focal weakness or numbness.   ____________________________________________   PHYSICAL EXAM:  VITAL SIGNS: ED Triage Vitals  Enc Vitals Group     BP 06/27/17 1824 (!) 152/97     Pulse Rate 06/27/17 1824 (!) 142     Resp 06/27/17 1824  18     Temp 06/27/17 1824 98.2 F (36.8 C)     Temp Source 06/27/17 1824 Oral     SpO2 06/27/17 1824 100 %     Weight 06/27/17 1824 163 lb (73.9 kg)     Height 06/27/17 1824 5\' 5"  (1.651 m)     Head Circumference --      Peak Flow --      Pain Score 06/27/17 1836 6     Pain Loc --      Pain Edu? --      Excl. in GC? --     Constitutional: Alert and oriented. Well appearing and in no acute distress. Eyes: Conjunctivae are normal.  Head: Atraumatic. Nose: No congestion/rhinnorhea. Mouth/Throat: Mucous membranes are moist.  Neck: No stridor.   Cardiovascular: Tachycardic with a heart rate of 129 in the room, regular rhythm. Grossly normal heart sounds.  Good peripheral circulation. Respiratory: Normal respiratory effort.  No retractions. Lungs CTAB. Gastrointestinal: Soft with moderate left lower quadrant tenderness to palpation without any rebound or guarding.  No distention. No CVA tenderness. Musculoskeletal: No lower extremity tenderness nor edema.  No joint effusions. Neurologic:  Normal speech and language. No gross focal neurologic deficits are appreciated. Skin:  Skin is warm, dry and intact. No rash noted. Psychiatric: Mood and affect are normal. Speech and behavior are normal.  ____________________________________________   LABS (all labs ordered are listed, but only abnormal results are displayed)  Labs Reviewed  COMPREHENSIVE METABOLIC PANEL - Abnormal; Notable for the following components:      Result Value   Potassium 3.3 (*)    Glucose, Bld 104 (*)    All other components within normal limits  URINALYSIS, COMPLETE (UACMP) WITH MICROSCOPIC - Abnormal; Notable for the following components:   Hgb urine dipstick MODERATE (*)    Ketones, ur 15 (*)    Leukocytes, UA TRACE (*)    Squamous Epithelial / LPF 0-5 (*)    Bacteria, UA RARE (*)    All other components within normal limits  LIPASE, BLOOD  CBC  POC URINE PREG, ED  POCT PREGNANCY, URINE    ____________________________________________  EKG   ____________________________________________  RADIOLOGY  Acute sigmoid diverticulitis without comp gating features. ____________________________________________   PROCEDURES  Procedure(s) performed:   Procedures  Critical Care performed:   ____________________________________________   INITIAL IMPRESSION / ASSESSMENT AND PLAN / ED COURSE  Pertinent labs & imaging results that were available during my care of the patient were reviewed by me and considered in my medical decision making (see chart for details).  Differential diagnosis includes, but is not limited to, ovarian cyst, ovarian torsion, acute appendicitis, diverticulitis, urinary tract infection/pyelonephritis, endometriosis, bowel obstruction, colitis, renal colic, gastroenteritis, hernia, fibroids, endometriosis, pregnancy related pain including ectopic pregnancy, etc.  As part of my medical decision making,  I reviewed the following data within the electronic MEDICAL RECORD NUMBER Old chart reviewed  ----------------------------------------- 10:09 PM on 06/27/2017 -----------------------------------------  Updated the patient as well as family regarding the diagnosis and treatment plan.  The patient says that she would like to take Tylenol at home for pain control.  She will be given her first dose of Cipro and Flagyl before leaving the emergency department and then please prescribe 10 days of both.  She was educated about return precautions especially fever, worsening abdominal pain or any other worsening or concerning symptoms.  She is understanding of the diagnosis as well as the treatment plan willing to comply.  We also talked about trying clear diet over the next few days and then advancing to her regular diet as the symptoms improve.      ____________________________________________   FINAL CLINICAL IMPRESSION(S) / ED  DIAGNOSES  Diverticulitis.    NEW MEDICATIONS STARTED DURING THIS VISIT:  New Prescriptions   No medications on file     Note:  This document was prepared using Dragon voice recognition software and may include unintentional dictation errors.     Myrna Blazer, MD 06/27/17 2209

## 2017-06-27 NOTE — ED Triage Notes (Signed)
Pt to ED via POV c/o Left lower abdominal pain. Pt state that the pain started on Monday. Pt states that the pain comes and goes. Pt states that she had had diarrhea 4 times today.Pt states that she has not had an appetite over the past few says. Pt denies fever. Pt in NAD at this time.

## 2017-06-29 ENCOUNTER — Encounter (INDEPENDENT_AMBULATORY_CARE_PROVIDER_SITE_OTHER): Payer: Self-pay | Admitting: Infectious Diseases

## 2017-07-02 ENCOUNTER — Encounter (INDEPENDENT_AMBULATORY_CARE_PROVIDER_SITE_OTHER): Payer: Self-pay | Admitting: Infectious Diseases

## 2017-07-05 ENCOUNTER — Ambulatory Visit
Admission: RE | Admit: 2017-07-05 | Discharge: 2017-07-05 | Disposition: A | Discharge status: Home / Self Care | Payer: BLUE CROSS/BLUE SHIELD | Source: Ambulatory Visit | Attending: General Surgery | Admitting: General Surgery

## 2017-07-05 DIAGNOSIS — N6489 Other specified disorders of breast: Secondary | ICD-10-CM

## 2017-07-09 ENCOUNTER — Ambulatory Visit: Payer: BLUE CROSS/BLUE SHIELD | Admitting: General Surgery

## 2017-07-09 ENCOUNTER — Encounter: Payer: Self-pay | Admitting: General Surgery

## 2017-07-09 VITALS — BP 150/82 | HR 78 | Resp 12 | Ht 65.0 in | Wt 162.0 lb

## 2017-07-09 DIAGNOSIS — N6489 Other specified disorders of breast: Secondary | ICD-10-CM

## 2017-07-09 NOTE — Progress Notes (Signed)
Patient ID: Chelsea Charles, female   DOB: 30-Dec-1970, 47 y.o.   MRN: [id number]  Chief Complaint  Patient presents with  . Follow-up    HPI Chelsea Charles is a 47 y.o. female who presents for a breast evaluation. The most recent mammogram was done on 07/05/2017. She is begin seen at [facility] for diverticulitis Patient does perform regular self breast checks and gets regular mammograms done.   This is a scheduled one-year follow-up status post excision of a complex sclerosing lesion on stereotactic biopsy in February 2018. HPI  Past Medical History:  Diagnosis Date  . Anemia   . Cervical dystonia   . Family history of ovarian cancer 05/2017   genetic testing letter sent  . History of abnormal mammogram   . Hx of degenerative disc disease   . Hypertension   . Tachycardia 2014  . Torticollis 01/2015    Past Surgical History:  Procedure Laterality Date  . BREAST BIOPSY Right 2011   stereo- neg-by Dr. Lemar Livings  . BREAST BIOPSY Right 2018   stero-negative  . CERVICAL BIOPSY  W/ LOOP ELECTRODE EXCISION  12/11/1996  . CESAREAN SECTION  2004  . CESAREAN SECTION  2002  . CHOLECYSTECTOMY    . COLONOSCOPY  2007  . TONSILLECTOMY    . VULVA /PERINEUM BIOPSY  1999  . WRIST SURGERY Left 12/2015   corrective osteotomy    Family History  Problem Relation Age of Onset  . Breast cancer Cousin 79       paternal second cousin  . Heart disease Mother   . Hypertension Mother   . Heart disease Father   . Hypertension Father   . Colon polyps Father   . Ovarian cancer Maternal Grandmother 56    Social History Social History   Tobacco Use  . Smoking status: Former Smoker    Types: Cigarettes    Last attempt to quit: 06/19/1995    Years since quitting: 22.0  . Smokeless tobacco: Never Used  Substance Use Topics  . Alcohol use: Yes    Comment: occasionally  . Drug use: No    Allergies  Allergen Reactions  . Sulfa Antibiotics Shortness Of Breath and Other (See Comments)    Elevated  heart rate  . Codeine Nausea And Vomiting and Nausea Only  . Penicillins Swelling and Other (See Comments)    Childhood reaction, facial swelling  . Doxycycline Palpitations    Current Outpatient Medications  Medication Sig Dispense Refill  . cyanocobalamin 1000 MCG tablet Take 1,000 mcg by mouth daily.    Marland Kitchen FLUCELVAX QUADRIVALENT 0.5 ML SUSY TO BE ADMINISTERED BY PHARMACIST FOR IMMUNIZATION  0  . fluticasone (FLONASE) 50 MCG/ACT nasal spray Place into the nose.    . ibuprofen (ADVIL,MOTRIN) 200 MG tablet Take by mouth.    . loratadine (CLARITIN) 10 MG tablet Take 10 mg by mouth daily as needed.     . Multiple Vitamins-Minerals (MULTIVITAMIN GUMMIES WOMENS PO) Take by mouth.    . Norethindrone-Ethinyl Estradiol-Fe Biphas (LO LOESTRIN FE) 1 MG-10 MCG / 10 MCG tablet Take 1 tablet by mouth daily. 28 tablet 11   No current facility-administered medications for this visit.     Review of Systems Review of Systems  Constitutional: Negative.   Respiratory: Negative.   Cardiovascular: Negative.     Blood pressure (!) 150/82, pulse 78, resp. rate 12, height 5\' 5"  (1.651 m), weight 162 lb (73.5 kg).  Physical Exam Physical Exam  Constitutional: She is oriented to  person, place, and time. She appears well-developed and well-nourished.  Eyes: Conjunctivae are normal. No scleral icterus.  Neck: Neck supple.  Cardiovascular: Normal rate, regular rhythm and normal heart sounds.  Pulmonary/Chest: Effort normal and breath sounds normal. Right breast exhibits no inverted nipple, no mass, no nipple discharge, no skin change and no tenderness. Left breast exhibits no inverted nipple, no mass, no nipple discharge, no skin change and no tenderness.  Lymphadenopathy:    She has no cervical adenopathy.    She has no axillary adenopathy.  Neurological: She is alert and oriented to person, place, and time.  Skin: Skin is warm and dry.    Data Reviewed Bilateral diagnostic mammogram dated July 05, 2017 was reviewed.  BI-RADS-3.  One-year follow-up recommended.  Assessment    No evidence of additional lesions in the breast status post removal of a complex sclerosing lesion on stereotactic biopsy.    Plan     Options for management again reviewed: 1) continued observation versus 2) operative excision.    The patient has been asked to return to the office in one year with a bilateral diagnostic mammogram. The patient is aware to call back for any questions or concerns.  HPI, Physical Exam, Assessment and Plan have been scribed under the direction and in the presence of Donnalee CurryJeffrey Kimanh Templeman, MD.  Ples SpecterJessica Qualls, CMA  I have completed the exam and reviewed the above documentation for accuracy and completeness.  I agree with the above.  [employer]gallery [employer] has been used and any errors in dictation or transcription are unintentional.  Donnalee CurryJeffrey Semya Klinke, M.D., F.A.C.S.  Merrily PewJeffrey W Latonya Knight 07/11/2017, 6:50 AM

## 2017-07-09 NOTE — Patient Instructions (Addendum)
The patient has been asked to return to the office in one year with a bilateral diagnostic mammogram.The patient is aware to call back for any questions or concerns. 

## 2017-07-10 ENCOUNTER — Ambulatory Visit
Admission: RE | Admit: 2017-07-10 | Discharge: 2017-07-10 | Disposition: A | Discharge status: Home / Self Care | Payer: BLUE CROSS/BLUE SHIELD | Source: Ambulatory Visit | Attending: Nurse Practitioner | Admitting: Nurse Practitioner

## 2017-07-10 ENCOUNTER — Other Ambulatory Visit: Payer: Self-pay | Admitting: Nurse Practitioner

## 2017-07-10 DIAGNOSIS — N329 Bladder disorder, unspecified: Secondary | ICD-10-CM | POA: Diagnosis not present

## 2017-07-10 DIAGNOSIS — K5732 Diverticulitis of large intestine without perforation or abscess without bleeding: Secondary | ICD-10-CM

## 2017-07-10 DIAGNOSIS — K5730 Diverticulosis of large intestine without perforation or abscess without bleeding: Secondary | ICD-10-CM | POA: Diagnosis not present

## 2017-07-10 HISTORY — DX: Essential (primary) hypertension: I10

## 2017-07-10 MED ORDER — IOPAMIDOL (ISOVUE-300) INJECTION 61%
100.0000 mL | Freq: Once | INTRAVENOUS | Status: AC | PRN
  Administered 2017-07-10: 100 mL via INTRAVENOUS

## 2017-09-03 ENCOUNTER — Ambulatory Visit: Payer: BLUE CROSS/BLUE SHIELD | Admitting: Certified Nurse Midwife

## 2017-09-03 ENCOUNTER — Encounter: Payer: Self-pay | Admitting: Certified Nurse Midwife

## 2017-09-03 VITALS — BP 102/62 | HR 99 | Ht 64.0 in | Wt 154.0 lb

## 2017-09-03 DIAGNOSIS — N9089 Other specified noninflammatory disorders of vulva and perineum: Secondary | ICD-10-CM

## 2017-09-03 NOTE — Progress Notes (Signed)
Obstetrics & Gynecology Office Visit   Chief Complaint:  Chief Complaint  Patient presents with  . Vaginitis    vaginal irritation    History of Present Illness: Chelsea Charles is a 47 year old  WF, G2 P2002, who presents with 2 vulvar lesions and she wishes to be checked to see if they are HPV. She has a remote history of HPV and cervical dysplasia in 1999/ 1998. There is a tender lesion on her left labia majora and another lesion to the right of her introitus that is not tender. She first noticed these lesions about 1 week ago.  Since her last annual in November 2018, she has been treated for diverticulitis in January 2019 and was on Ciprofloxacin and Flagyl. She was scheduled for a colonoscopy this month, but it was rescheduled after she was diagnosed with the flu. She currently uses OCPs for contraception   Review of Systems:  Review of Systems  Constitutional: Positive for fever (with the flu). Negative for chills and weight loss.  HENT: Positive for congestion (with flu) and sore throat (with flu). Negative for sinus pain.   Eyes: Negative for blurred vision and pain.  Respiratory: Positive for cough and wheezing. Negative for hemoptysis and shortness of breath.   Cardiovascular: Negative for chest pain, palpitations and leg swelling.  Gastrointestinal: Negative for abdominal pain, blood in stool, diarrhea, heartburn, nausea and vomiting.  Genitourinary: Negative for dysuria, frequency, hematuria and urgency.  Musculoskeletal: Negative for back pain, joint pain and myalgias.  Skin: Positive for itching. Negative for rash.  Neurological: Positive for headaches. Negative for dizziness and tingling.  Endo/Heme/Allergies: Positive for environmental allergies. Negative for polydipsia. Does not bruise/bleed easily.       Negative for hirsutism; positive for hot flashes   Psychiatric/Behavioral: Negative for depression. The patient is not nervous/anxious and does not have insomnia.        Past Medical History:  Past Medical History:  Diagnosis Date  . Anemia   . Cervical dystonia   . Family history of ovarian cancer 05/2017   genetic testing letter sent  . History of abnormal mammogram   . Hx of degenerative disc disease   . Hypertension   . Tachycardia 2014  . Torticollis 01/2015    Past Surgical History:  Past Surgical History:  Procedure Laterality Date  . BREAST BIOPSY Right 2011   stereo- neg-by Dr. Lemar Livings  . BREAST BIOPSY Right 2018   stero-negative  . CERVICAL BIOPSY  W/ LOOP ELECTRODE EXCISION  12/11/1996  . CESAREAN SECTION  2004  . CESAREAN SECTION  2002  . CHOLECYSTECTOMY    . COLONOSCOPY  2007  . TONSILLECTOMY    . VULVA /PERINEUM BIOPSY  1999  . WRIST SURGERY Left 12/2015   corrective osteotomy    Gynecologic History: Patient's last menstrual period was 08/05/2017 (exact date).  Obstetric History: [id number]  Family History:  Family History  Problem Relation Age of Onset  . Breast cancer Cousin 10       paternal second cousin  . Heart disease Mother   . Hypertension Mother   . Heart disease Father   . Hypertension Father   . Colon polyps Father   . Ovarian cancer Maternal Grandmother 71    Social History:  Social History   Socioeconomic History  . Marital status: Married    Spouse name: Not on file  . Number of children: 2  . Years of education: Not on file  . Highest education  level: Not on file  Social Needs  . Financial resource strain: Not on file  . Food insecurity - worry: Not on file  . Food insecurity - inability: Not on file  . Transportation needs - medical: Not on file  . Transportation needs - non-medical: Not on file  Occupational History  . Not on file  Tobacco Use  . Smoking status: Former Smoker    Types: Cigarettes    Last attempt to quit: 06/19/1995    Years since quitting: 22.2  . Smokeless tobacco: Never Used  Substance and Sexual Activity  . Alcohol use: Yes    Comment: occasionally  . Drug  use: No  . Sexual activity: Yes    Partners: Male    Birth control/protection: Pill  Other Topics Concern  . Not on file  Social History Narrative  . Not on file    Allergies:  Allergies  Allergen Reactions  . Sulfa Antibiotics Shortness Of Breath and Other (See Comments)    Elevated heart rate  . Codeine Nausea And Vomiting and Nausea Only  . Penicillins Swelling and Other (See Comments)    Childhood reaction, facial swelling  . Doxycycline Palpitations    Medications: Prior to Admission medications   Medication Sig Start Date End Date Taking? Authorizing Provider  cyanocobalamin 1000 MCG tablet Take 1,000 mcg by mouth daily.   Yes [provider]  fluticasone (FLONASE) 50 MCG/ACT nasal spray Place into the nose. 05/14/16  Yes [provider]  ibuprofen (ADVIL,MOTRIN) 200 MG tablet Take by mouth.   Yes [provider]  loratadine (CLARITIN) 10 MG tablet Take 10 mg by mouth daily as needed.    Yes [provider]  Multiple Vitamins-Minerals (MULTIVITAMIN GUMMIES WOMENS PO) Take by mouth.   Yes [provider]  Norethindrone-Ethinyl Estradiol-Fe Biphas (LO LOESTRIN FE) 1 MG-10 MCG / 10 MCG tablet Take 1 tablet by mouth daily. 05/17/17  Yes Farrel ConnersGutierrez, Odette Watanabe, CNM    Physical Exam Vitals:BP 102/62   Pulse 99   Ht 5\' 4"  (1.626 m)   Wt 154 lb (69.9 kg)   LMP 08/05/2017 (Exact Date)   BMI 26.43 kg/m    Physical Exam  Constitutional: She is oriented to person, place, and time. She appears well-developed and well-nourished. No distress.  Cardiovascular: Normal rate.  Respiratory: Effort normal.  Genitourinary:     Genitourinary Comments: Vulva: papular, 4-425mm, tender lesion on left mid labia, with ? Ulcerated area on top. Flat plaque, flesh colored to the right of the introitus.   Neurological: She is alert and oriented to person, place, and time.  Psychiatric: She has a normal mood and affect. Her behavior is normal.      Assessment: 47 y.o. [id number] with vulvar lesions x 2 1. Left labial lesion-R/O herpetic lesion 2 Right lesion at introitus-R/O VIN/ HPV  Plan: Herpes culture of lesion #1 Patient to return next week for biopsy of lesion #2.(Wishes to delay until after beach trip this weekend).  Farrel Connersolleen Zelig Gacek, CNM

## 2017-09-08 ENCOUNTER — Encounter: Payer: Self-pay | Admitting: Certified Nurse Midwife

## 2017-09-12 ENCOUNTER — Encounter: Payer: Self-pay | Admitting: Certified Nurse Midwife

## 2017-09-12 ENCOUNTER — Ambulatory Visit: Payer: BLUE CROSS/BLUE SHIELD | Admitting: Certified Nurse Midwife

## 2017-09-12 VITALS — BP 110/70 | HR 84 | Ht 65.0 in | Wt 153.0 lb

## 2017-09-12 DIAGNOSIS — N9089 Other specified noninflammatory disorders of vulva and perineum: Secondary | ICD-10-CM

## 2017-09-12 NOTE — Progress Notes (Signed)
Patient ID: Chelsea Charles, female   DOB: 1970/10/10, 47 y.o.   MRN: [id number]  Chief Complaint  Patient presents with  . Vulvar biopsy    HPI Chelsea Charles is a 47 y.o. female. who presents for vulvar biopsies of two lesions. See the 09/03/2017 note.  Indication: papular lesion on left labia has gotten smaller in size since her visit last week.  The maculopapular flat lesion on the right side of the introitus has become a little tender and itchy.    Past Medical History:  Diagnosis Date  . Anemia   . Cervical dystonia   . Family history of ovarian cancer 05/2017   genetic testing letter sent  . History of abnormal mammogram   . Hx of degenerative disc disease   . Hypertension   . Tachycardia 2014  . Torticollis 01/2015    Past Surgical History:  Procedure Laterality Date  . BREAST BIOPSY Right 2011   stereo- neg-by Dr. Lemar Livings  . BREAST BIOPSY Right 2018   stero-negative  . CERVICAL BIOPSY  W/ LOOP ELECTRODE EXCISION  12/11/1996  . CESAREAN SECTION  2004  . CESAREAN SECTION  2002  . CHOLECYSTECTOMY    . COLONOSCOPY  2007  . TONSILLECTOMY    . VULVA /PERINEUM BIOPSY  1999  . WRIST SURGERY Left 12/2015   corrective osteotomy    Family History  Problem Relation Age of Onset  . Breast cancer Cousin 85       paternal second cousin  . Heart disease Mother   . Hypertension Mother   . Heart disease Father   . Hypertension Father   . Colon polyps Father   . Ovarian cancer Maternal Grandmother 53    Social History Social History   Tobacco Use  . Smoking status: Former Smoker    Types: Cigarettes    Last attempt to quit: 06/19/1995    Years since quitting: 22.2  . Smokeless tobacco: Never Used  Substance Use Topics  . Alcohol use: Yes    Comment: occasionally  . Drug use: No    Allergies  Allergen Reactions  . Sulfa Antibiotics Shortness Of Breath and Other (See Comments)    Elevated heart rate  . Codeine Nausea And Vomiting and Nausea Only  .  Penicillins Swelling and Other (See Comments)    Childhood reaction, facial swelling  . Doxycycline Palpitations    Current Outpatient Medications  Medication Sig Dispense Refill  . cyanocobalamin 1000 MCG tablet Take 1,000 mcg by mouth daily.    . fluticasone (FLONASE) 50 MCG/ACT nasal spray Place into the nose.    . ibuprofen (ADVIL,MOTRIN) 200 MG tablet Take by mouth.    . loratadine (CLARITIN) 10 MG tablet Take 10 mg by mouth daily as needed.     . Multiple Vitamins-Minerals (MULTIVITAMIN GUMMIES WOMENS PO) Take by mouth.    . Norethindrone-Ethinyl Estradiol-Fe Biphas (LO LOESTRIN FE) 1 MG-10 MCG / 10 MCG tablet Take 1 tablet by mouth daily. 28 tablet 11   No current facility-administered medications for this visit.     Review of Systems Review of Systems-see HPI  Blood pressure 110/70, pulse 84, height 5\' 5"  (1.651 m), weight 153 lb (69.4 kg), last menstrual period 08/15/2017.  Physical Exam Physical Exam  Constitutional: She is oriented to person, place, and time. She appears well-developed and well-nourished. No distress.  Cardiovascular: Normal rate.  Pulmonary/Chest: Effort normal.  Genitourinary:     Genitourinary Comments: Vulva: 3 mm papular lesion on left mid  labia majora with a ? Umbilication Flat maculopapular lesion to the right of introitus, part of which is hyperpigmented and part which is white with a little fissure in the mucosa  Neurological: She is alert and oriented to person, place, and time.  Psychiatric: She has a normal mood and affect. Her behavior is normal.    Assessment: Vulvar lesions x 2  1. Possible molluscum on left labia majora  2  R/O HPV, VIN, lichens sclerosis  Discussed biopsying both of these lesions. Reviewed procedure and risks of bleeding and infection. Informed consent obtained  Procedure: Prepped  Both lesions with Betadiene Local anesthesia with 2%  Lidocaine injected 3  mm punch biopsy performed per protocol x  2 (lower  lesion first) Silver Nitrate applied:  Yes Well tolerated  Specimens appropriately identified and sent to pathology    Plan: Post biopsy care discussed with patient To report signs of infection and persistent bleeding    Follow-up: 1 weeks for results and follow up      [name] 09/12/2017, 3:12 PM

## 2017-09-20 ENCOUNTER — Ambulatory Visit: Payer: BLUE CROSS/BLUE SHIELD | Admitting: Certified Nurse Midwife

## 2017-09-20 LAB — PATHOLOGY

## 2017-09-24 ENCOUNTER — Ambulatory Visit: Payer: BLUE CROSS/BLUE SHIELD | Admitting: Certified Nurse Midwife

## 2017-09-24 ENCOUNTER — Encounter: Payer: Self-pay | Admitting: Certified Nurse Midwife

## 2017-09-24 VITALS — BP 112/66 | HR 85 | Ht 65.0 in | Wt 157.0 lb

## 2017-09-24 DIAGNOSIS — L900 Lichen sclerosus et atrophicus: Secondary | ICD-10-CM | POA: Diagnosis not present

## 2017-09-24 MED ORDER — CLOBETASOL PROPIONATE 0.05 % EX OINT: TOPICAL_OINTMENT | CUTANEOUS | 2 refills | Status: DC

## 2017-09-24 NOTE — Progress Notes (Signed)
  HPI: Emeline DarlingCharity Ahuja is a 47 year old WF who presents for a follow up visit after having vulvar biopsies on 3/28. She reports that the biopsy site on the left labia majora is a little itchy but otherwise both biopsy sites are healing well.   The biopsy results are as follows: Diagnosis:  Part A: VAGINAL INTROITUS, RIGHT, BIOPSY:  - BENIGN VAGINAL TISSUE WITH ACANTHOSIS, SUBMUCOSAL FIBROSIS AND  CHRONIC INFLAMMATION.  Part B: VULVA, LEFT LABIA MAJORA, BIOPSY:  - BENIGN VULVAR TISSUE WITH CHRONIC INFLAMMATION.  Note: The differential diagnosis for part A includes non-specific  vaginitis and early lichen sclerosus. Negative for features  of molluscum contagiosum. Multiple additional deeper H/E levels  were examined.     PMHx: She  has a past medical history of Anemia, Cervical dystonia, Family history of ovarian cancer (05/2017), History of abnormal mammogram, degenerative disc disease, Hypertension, Tachycardia (2014), and Torticollis (01/2015). Also,  has a past surgical history that includes Wrist surgery (Left, 12/2015); Cesarean section (2004); Cesarean section (2002); Colonoscopy (2007); Cholecystectomy; Cervical biopsy w/ loop electrode excision (12/11/1996); Tonsillectomy; Vulva / perineum biopsy (1999); Breast biopsy (Right, 2011); and Breast biopsy (Right, 2018)., family history includes Breast cancer (age of onset: 6860) in her cousin; Colon polyps in her father; Heart disease in her father and mother; Hypertension in her father and mother; Ovarian cancer (age of onset: 1179) in her maternal grandmother.,  reports that she quit smoking about 22 years ago. Her smoking use included cigarettes. She has never used smokeless tobacco. She reports that she drinks alcohol. She reports that she does not use drugs.  She has a current medication list which includes the following prescription(s): cyanocobalamin, fluticasone, ibuprofen, loratadine, multiple vitamins-minerals, and norethindrone-ethinyl  estradiol-fe biphas. Also, is allergic to sulfa antibiotics; codeine; penicillins; and doxycycline.  ROS  Objective: Vital signs: BP 112/66   Pulse 85   Ht 5\' 5"  (1.651 m)   Wt 157 lb (71.2 kg)   BMI 26.13 kg/m  Physical examination Constitutional NAD, Conversant  Vulva:  Both biopsy sites are well healed, no bleeding, inflammation, or drainage seen   Perineal area whitish in color  Neuro: Grossly intact  Psych: Oriented to PPT.  Normal mood. Normal affect.   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   Farrel Connersolleen Vincent Ehrler, CNM

## 2017-10-17 ENCOUNTER — Encounter: Payer: Self-pay | Admitting: *Deleted

## 2017-10-18 ENCOUNTER — Encounter
Admission: RE | Disposition: A | Discharge status: Home / Self Care | Payer: Self-pay | Source: Ambulatory Visit | Attending: Unknown Physician Specialty

## 2017-10-18 ENCOUNTER — Encounter: Payer: Self-pay | Admitting: *Deleted

## 2017-10-18 ENCOUNTER — Ambulatory Visit
Admission: RE | Admit: 2017-10-18 | Discharge: 2017-10-18 | Disposition: A | Discharge status: Home / Self Care | Payer: BLUE CROSS/BLUE SHIELD | Source: Ambulatory Visit | Attending: Unknown Physician Specialty | Admitting: Unknown Physician Specialty

## 2017-10-18 ENCOUNTER — Ambulatory Visit: Payer: BLUE CROSS/BLUE SHIELD | Admitting: Anesthesiology

## 2017-10-18 DIAGNOSIS — Z880 Allergy status to penicillin: Secondary | ICD-10-CM | POA: Diagnosis not present

## 2017-10-18 DIAGNOSIS — K635 Polyp of colon: Secondary | ICD-10-CM | POA: Diagnosis not present

## 2017-10-18 DIAGNOSIS — K640 First degree hemorrhoids: Secondary | ICD-10-CM | POA: Diagnosis not present

## 2017-10-18 DIAGNOSIS — Z8249 Family history of ischemic heart disease and other diseases of the circulatory system: Secondary | ICD-10-CM | POA: Diagnosis not present

## 2017-10-18 DIAGNOSIS — Z79899 Other long term (current) drug therapy: Secondary | ICD-10-CM | POA: Insufficient documentation

## 2017-10-18 DIAGNOSIS — Z1211 Encounter for screening for malignant neoplasm of colon: Secondary | ICD-10-CM | POA: Diagnosis not present

## 2017-10-18 DIAGNOSIS — Z8371 Family history of colonic polyps: Secondary | ICD-10-CM | POA: Insufficient documentation

## 2017-10-18 DIAGNOSIS — Z885 Allergy status to narcotic agent status: Secondary | ICD-10-CM | POA: Diagnosis not present

## 2017-10-18 DIAGNOSIS — Z87891 Personal history of nicotine dependence: Secondary | ICD-10-CM | POA: Insufficient documentation

## 2017-10-18 DIAGNOSIS — Z882 Allergy status to sulfonamides status: Secondary | ICD-10-CM | POA: Diagnosis not present

## 2017-10-18 DIAGNOSIS — I10 Essential (primary) hypertension: Secondary | ICD-10-CM | POA: Insufficient documentation

## 2017-10-18 DIAGNOSIS — Z881 Allergy status to other antibiotic agents status: Secondary | ICD-10-CM | POA: Insufficient documentation

## 2017-10-18 DIAGNOSIS — K5730 Diverticulosis of large intestine without perforation or abscess without bleeding: Secondary | ICD-10-CM | POA: Insufficient documentation

## 2017-10-18 HISTORY — PX: COLONOSCOPY WITH PROPOFOL: SHX5780

## 2017-10-18 LAB — POCT PREGNANCY, URINE: PREG TEST UR: NEGATIVE

## 2017-10-18 SURGERY — COLONOSCOPY WITH PROPOFOL
Anesthesia: General

## 2017-10-18 MED ORDER — MIDAZOLAM HCL 2 MG/2ML IJ SOLN
INTRAMUSCULAR | Status: AC
Start: 2017-10-18 — End: 2017-10-18
  Filled 2017-10-18: qty 2

## 2017-10-18 MED ORDER — MIDAZOLAM HCL 5 MG/5ML IJ SOLN
INTRAMUSCULAR | Status: DC | PRN
  Administered 2017-10-18 (×2): 2 mg via INTRAVENOUS

## 2017-10-18 MED ORDER — LIDOCAINE HCL (PF) 2 % IJ SOLN
INTRAMUSCULAR | Status: DC | PRN
  Administered 2017-10-18: 60 mg

## 2017-10-18 MED ORDER — LIDOCAINE HCL (PF) 2 % IJ SOLN
INTRAMUSCULAR | Status: AC
  Filled 2017-10-18: qty 10

## 2017-10-18 MED ORDER — PROPOFOL 10 MG/ML IV BOLUS
INTRAVENOUS | Status: DC | PRN
  Administered 2017-10-18: 40 mg via INTRAVENOUS
  Administered 2017-10-18: 30 mg via INTRAVENOUS

## 2017-10-18 MED ORDER — MIDAZOLAM HCL 2 MG/2ML IJ SOLN
INTRAMUSCULAR | Status: AC
  Filled 2017-10-18: qty 2

## 2017-10-18 MED ORDER — FENTANYL CITRATE (PF) 100 MCG/2ML IJ SOLN
INTRAMUSCULAR | Status: AC
  Filled 2017-10-18: qty 2

## 2017-10-18 MED ORDER — FENTANYL CITRATE (PF) 100 MCG/2ML IJ SOLN
INTRAMUSCULAR | Status: DC | PRN
  Administered 2017-10-18 (×2): 50 ug via INTRAVENOUS

## 2017-10-18 MED ORDER — SODIUM CHLORIDE 0.9 % IV SOLN: INTRAVENOUS | Status: DC

## 2017-10-18 MED ORDER — PROPOFOL 500 MG/50ML IV EMUL
INTRAVENOUS | Status: DC | PRN
  Administered 2017-10-18: 75 ug/kg/min via INTRAVENOUS

## 2017-10-18 MED ORDER — SODIUM CHLORIDE 0.9 % IV SOLN
INTRAVENOUS | Status: DC
  Administered 2017-10-18: 1000 mL via INTRAVENOUS

## 2017-10-18 NOTE — Anesthesia Preprocedure Evaluation (Signed)
Anesthesia Evaluation  Patient identified by MRN, date of birth, ID band Patient awake    Reviewed: Allergy & Precautions, H&P , NPO status , Patient's Chart, lab work & pertinent test results, reviewed documented beta blocker date and time   Airway Mallampati: II   Neck ROM: full    Dental  (+) Teeth Intact   Pulmonary neg pulmonary ROS, former smoker,    Pulmonary exam normal        Cardiovascular Exercise Tolerance: Good hypertension, On Medications negative cardio ROS Normal cardiovascular exam Rhythm:regular Rate:Normal     Neuro/Psych  Neuromuscular disease negative neurological ROS  negative psych ROS   GI/Hepatic negative GI ROS, Neg liver ROS,   Endo/Other  negative endocrine ROS  Renal/GU negative Renal ROS  negative genitourinary   Musculoskeletal   Abdominal   Peds  Hematology negative hematology ROS (+) anemia ,   Anesthesia Other Findings Past Medical History: No date: Anemia No date: Cervical dystonia 05/2017: Family history of ovarian cancer     Comment:  genetic testing letter sent No date: History of abnormal mammogram No date: Hx of degenerative disc disease No date: Hypertension 2014: Tachycardia 01/2015: Torticollis Past Surgical History: 2011: BREAST BIOPSY; Right     Comment:  stereo- neg-by Dr. Lemar Livings 2018: BREAST BIOPSY; Right     Comment:  stero-negative 12/11/1996: CERVICAL BIOPSY  W/ LOOP ELECTRODE EXCISION 2004: CESAREAN SECTION 2002: CESAREAN SECTION No date: CHOLECYSTECTOMY 2007: COLONOSCOPY No date: TONSILLECTOMY 1999: VULVA /PERINEUM BIOPSY 12/2015: WRIST SURGERY; Left     Comment:  corrective osteotomy   Reproductive/Obstetrics negative OB ROS                             Anesthesia Physical Anesthesia Plan  ASA: II  Anesthesia Plan: General   Post-op Pain Management:    Induction:   PONV Risk Score and Plan:   Airway  Management Planned:   Additional Equipment:   Intra-op Plan:   Post-operative Plan:   Informed Consent: I have reviewed the patients History and Physical, chart, labs and discussed the procedure including the risks, benefits and alternatives for the proposed anesthesia with the patient or authorized representative who has indicated his/her understanding and acceptance.   Dental Advisory Given  Plan Discussed with: CRNA  Anesthesia Plan Comments:         Anesthesia Quick Evaluation

## 2017-10-18 NOTE — Transfer of Care (Signed)
Immediate Anesthesia Transfer of Care Note  Patient: Chelsea Charles  Procedure(s) Performed: COLONOSCOPY WITH PROPOFOL (N/A )  Patient Location: PACU  Anesthesia Type:General  Level of Consciousness: sedated  Airway & Oxygen Therapy: Patient Spontanous Breathing and Patient connected to nasal cannula oxygen  Post-op Assessment: Report given to RN and Post -op Vital signs reviewed and stable  Post vital signs: Reviewed and stable  Last Vitals:  Vitals Value Taken Time  BP 107/83 10/18/2017 10:27 AM  Temp 36.8 C 10/18/2017 10:27 AM  Pulse 101 10/18/2017 10:27 AM  Resp 21 10/18/2017 10:27 AM  SpO2 95 % 10/18/2017 10:27 AM  Vitals shown include unvalidated device data.  Last Pain:  Vitals:   10/18/17 1027  TempSrc: Tympanic  PainSc:          Complications: No apparent anesthesia complications

## 2017-10-18 NOTE — Anesthesia Post-op Follow-up Note (Signed)
Anesthesia QCDR form completed.        

## 2017-10-18 NOTE — Op Note (Addendum)
[facility] Gastroenterology Patient Name: Chelsea Charles Procedure Date: 10/18/2017 9:51 AM MRN: [id number] Account #: [id number] Date of Birth: 1970/07/13 Admit Type: Outpatient Age: 47 Room: [facility] ENDO ROOM 3 Gender: Female Note Status: Finalized Procedure:            Colonoscopy Indications:          Colon cancer screening in patient at increased risk:                        Family history of 1st-degree relative with colon polyps Providers:            Scot Jun, MD Referring MD:         Clydie Braun (Referring MD) Medicines:            Propofol per Anesthesia Complications:        No immediate complications. Procedure:            Pre-Anesthesia Assessment:                       - After reviewing the risks and benefits, the patient                        was deemed in satisfactory condition to undergo the                        procedure.                       After obtaining informed consent, the colonoscope was                        passed under direct vision. Throughout the procedure,                        the patient's blood pressure, pulse, and oxygen                        saturations were monitored continuously. The                        Colonoscope was introduced through the anus and                        advanced to the the cecum, identified by appendiceal                        orifice and ileocecal valve. The colonoscopy was                        performed without difficulty. The patient tolerated the                        procedure well. The quality of the bowel preparation                        was excellent. Findings:      Two sessile polyps were found in the ascending colon. The polyps were       diminutive in size. These polyps were removed with a jumbo cold forceps.       Resection and retrieval were complete.  Internal hemorrhoids were found during endoscopy. The hemorrhoids were       small and Grade I (internal  hemorrhoids that do not prolapse).      A few small-mouthed diverticula were found in the sigmoid colon and       ascending colon.      The exam was otherwise without abnormality.      A diminutive polyp was found in the sigmoid colon. The polyp was       sessile. The polyp was removed with a jumbo cold forceps. Resection and       retrieval were complete. Impression:           - Two diminutive polyps in the ascending colon, removed                        with a jumbo cold forceps. Resected and retrieved.                       - Internal hemorrhoids.                       - Diverticulosis in the sigmoid colon and in the                        ascending colon.                       - The examination was otherwise normal. Recommendation:       - Await pathology results. Scot Jun, MD 10/18/2017 10:31:53 AM This report has been signed electronically. Number of Addenda: 0 Note Initiated On: 10/18/2017 9:51 AM Scope Withdrawal Time: 0 hours 13 minutes 57 seconds  Total Procedure Duration: 0 hours 23 minutes 22 seconds       [facility]

## 2017-10-18 NOTE — H&P (Signed)
Primary Care Physician:  Mick Sell, MD Primary Gastroenterologist:  Dr. Mechele Collin  Pre-Procedure History & Physical: HPI:  Chelsea Charles is a 47 y.o. female is here for an colonoscopy.  Done for screening for FH colon polyps in father.   Past Medical History:  Diagnosis Date  . Anemia   . Cervical dystonia   . Family history of ovarian cancer 05/2017   genetic testing letter sent  . History of abnormal mammogram   . Hx of degenerative disc disease   . Hypertension   . Tachycardia 2014  . Torticollis 01/2015    Past Surgical History:  Procedure Laterality Date  . BREAST BIOPSY Right 2011   stereo- neg-by Dr. Lemar Livings  . BREAST BIOPSY Right 2018   stero-negative  . CERVICAL BIOPSY  W/ LOOP ELECTRODE EXCISION  12/11/1996  . CESAREAN SECTION  2004  . CESAREAN SECTION  2002  . CHOLECYSTECTOMY    . COLONOSCOPY  2007  . TONSILLECTOMY    . VULVA /PERINEUM BIOPSY  1999  . WRIST SURGERY Left 12/2015   corrective osteotomy    Prior to Admission medications   Medication Sig Start Date End Date Taking? Authorizing Provider  clobetasol ointment (TEMOVATE) 0.05 % Apply to affected area every night for 2 weeks, then three times a week 09/24/17   Farrel Conners, CNM  cyanocobalamin 1000 MCG tablet Take 1,000 mcg by mouth daily.    [provider]  fluticasone (FLONASE) 50 MCG/ACT nasal spray Place into the nose. 05/14/16   [provider]  ibuprofen (ADVIL,MOTRIN) 200 MG tablet Take by mouth.    [provider]  loratadine (CLARITIN) 10 MG tablet Take 10 mg by mouth daily as needed.     [provider]  Multiple Vitamins-Minerals (MULTIVITAMIN GUMMIES WOMENS PO) Take by mouth.    [provider]  Norethindrone-Ethinyl Estradiol-Fe Biphas (LO LOESTRIN FE) 1 MG-10 MCG / 10 MCG tablet Take 1 tablet by mouth daily. 05/17/17   Farrel Conners, CNM    Allergies as of 10/17/2017 - Review Complete 10/17/2017  Allergen Reaction  Noted  . Sulfa antibiotics Shortness Of Breath and Other (See Comments) 04/14/2013  . Codeine Nausea And Vomiting and Nausea Only 04/14/2013  . Penicillins Swelling and Other (See Comments) 04/14/2013  . Doxycycline Palpitations 10/31/2016    Family History  Problem Relation Age of Onset  . Breast cancer Cousin 38       paternal second cousin  . Heart disease Mother   . Hypertension Mother   . Heart disease Father   . Hypertension Father   . Colon polyps Father   . Ovarian cancer Maternal Grandmother 7    Social History   Socioeconomic History  . Marital status: Married    Spouse name: Not on file  . Number of children: 2  . Years of education: Not on file  . Highest education level: Not on file  Occupational History  . Not on file  Social Needs  . Financial resource strain: Not on file  . Food insecurity:    Worry: Not on file    Inability: Not on file  . Transportation needs:    Medical: Not on file    Non-medical: Not on file  Tobacco Use  . Smoking status: Former Smoker    Types: Cigarettes    Last attempt to quit: 06/19/1995    Years since quitting: 22.3  . Smokeless tobacco: Never Used  Substance and Sexual Activity  . Alcohol use:  Yes    Comment: occasionally  . Drug use: No  . Sexual activity: Yes    Partners: Male    Birth control/protection: Pill  Lifestyle  . Physical activity:    Days per week: 3 days    Minutes per session: 30 min  . Stress: Rather much  Relationships  . Social connections:    Talks on phone: More than three times a week    Gets together: More than three times a week    Attends religious service: More than 4 times per year    Active member of club or organization: Yes    Attends meetings of clubs or organizations: More than 4 times per year    Relationship status: Married  . Intimate partner violence:    Fear of current or ex partner: No    Emotionally abused: No    Physically abused: No    Forced sexual activity: No   Other Topics Concern  . Not on file  Social History Narrative  . Not on file    Review of Systems: See HPI, otherwise negative ROS  Physical Exam: BP (!) 132/91   Pulse (!) 103   Temp (!) 97.3 F (36.3 C) (Tympanic)   Resp 18   Ht  (1.651 m)   Wt 65.8 kg (145 lb)   SpO2 100%   BMI 24.13 kg/m  General:   Alert,  pleasant and cooperative in NAD Head:  Normocephalic and atraumatic. Neck:  Supple; no masses or thyromegaly. Lungs:  Clear throughout to auscultation.    Heart:  Regular rate and rhythm. Abdomen:  Soft, nontender and nondistended. Normal bowel sounds, without guarding, and without rebound.   Neurologic:  Alert and  oriented x4;  grossly normal neurologically.  Impression/Plan: Chelsea Charles is here for an colonoscopy to be performed for screening due to FH colon polyps in father.  Risks, benefits, limitations, and alternatives regarding  colonoscopy have been reviewed with the patient.  Questions have been answered.  All parties agreeable.   Lynnae Prude, MD  10/18/2017, 9:49 AM

## 2017-10-21 ENCOUNTER — Encounter: Payer: Self-pay | Admitting: Unknown Physician Specialty

## 2017-10-21 LAB — SURGICAL PATHOLOGY

## 2017-10-21 NOTE — Anesthesia Postprocedure Evaluation (Signed)
Anesthesia Post Note  Patient: [name]  Procedure(s) Performed: COLONOSCOPY WITH PROPOFOL (N/A )  Patient location during evaluation: PACU Anesthesia Type: General Level of consciousness: awake and alert Pain management: pain level controlled Vital Signs Assessment: post-procedure vital signs reviewed and stable Respiratory status: spontaneous breathing, nonlabored ventilation, respiratory function stable and patient connected to nasal cannula oxygen Cardiovascular status: blood pressure returned to baseline and stable Postop Assessment: no apparent nausea or vomiting Anesthetic complications: no     Last Vitals:  Vitals:   10/18/17 1027 10/18/17 1037  BP: 107/83 136/81  Pulse: (!) 103   Resp: 20   Temp: 36.8 C   SpO2: 98%     Last Pain:  Vitals:   10/18/17 1047  TempSrc:   PainSc: 0-No pain                 Yevette Edwards

## 2017-11-15 ENCOUNTER — Ambulatory Visit: Payer: BLUE CROSS/BLUE SHIELD | Admitting: Certified Nurse Midwife

## 2017-11-29 ENCOUNTER — Encounter: Payer: Self-pay | Admitting: Certified Nurse Midwife

## 2017-11-29 ENCOUNTER — Ambulatory Visit (INDEPENDENT_AMBULATORY_CARE_PROVIDER_SITE_OTHER): Payer: BLUE CROSS/BLUE SHIELD | Admitting: Certified Nurse Midwife

## 2017-11-29 VITALS — BP 128/78 | HR 106 | Ht 65.0 in | Wt 154.0 lb

## 2017-11-29 DIAGNOSIS — L900 Lichen sclerosus et atrophicus: Secondary | ICD-10-CM | POA: Diagnosis not present

## 2017-11-29 NOTE — Progress Notes (Signed)
  History of Present Illness:  Chelsea Charles is a 47 y.o. who was started on  Current Outpatient Medications on File Prior to Visit  Medication Sig Dispense Refill  . clobetasol ointment (TEMOVATE) 0.05 % Apply to affected area every night for 2 weeks, then three times a week 30 g 2  for early lichen sclerosis pproximately 2 months ago. Since that time, she states that her symptoms have resolved. She no longer is having any itching. She reports some perineal/perirectal discomfort when she has a diverticulitis flare. She is currently using the clobetasol 3x/week. Since her last visit, she has had a colonoscopy and two hyperplastic polyps were removed. It did confirm diverticulosis. Her next colonoscopy is due in 5 years. She has not had any withdrawal bleed on the last 2 packs of Lo Loestrin and is having hot flashes PMHx: She  has a past medical history of Anemia, Cervical dystonia, Family history of ovarian cancer (05/2017), History of abnormal mammogram, degenerative disc disease, Hypertension, Tachycardia (2014), and Torticollis (01/2015). Also,  has a past surgical history that includes Wrist surgery (Left, 12/2015); Cesarean section (2004); Cesarean section (2002); Colonoscopy (2007); Cholecystectomy; Cervical biopsy w/ loop electrode excision (12/11/1996); Tonsillectomy; Vulva / perineum biopsy (1999); Breast biopsy (Right, 2011); Breast biopsy (Right, 2018); and Colonoscopy with propofol (N/A, 10/18/2017)., family history includes Breast cancer (age of onset: 2660) in her cousin; Colon polyps in her father; Heart disease in her father and mother; Hypertension in her father and mother; Ovarian cancer (age of onset: 2379) in her maternal grandmother.,  reports that she quit smoking about 22 years ago. Her smoking use included cigarettes. She has never used smokeless tobacco. She reports that she drinks alcohol. She reports that she does not use drugs.  She has a current medication list which includes the  following prescription(s): clobetasol ointment, cyanocobalamin, elderberry, fluticasone, ibuprofen, loratadine, multiple vitamins-minerals, and norethindrone-ethinyl estradiol-fe biphas. Also, is allergic to sulfa antibiotics; codeine; penicillins; and doxycycline.  ROS  Physical Exam:  BP 128/78   Pulse (!) 106   Ht 5\' 5"  (1.651 m)   Wt 154 lb (69.9 kg)   BMI 25.63 kg/m  Body mass index is 25.63 kg/m. Constitutional: Well nourished, well developed female in no acute distress.  Pelvic: External/BUS: no whitening, inflammation, or lesions seen Neuro: Grossly intact Psych:  Normal mood and affect.    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  [name], CNM [facility], [facility] 11/29/2017  11:54 AM

## 2018-05-04 ENCOUNTER — Other Ambulatory Visit: Payer: Self-pay | Admitting: Certified Nurse Midwife

## 2018-05-05 ENCOUNTER — Other Ambulatory Visit: Payer: Self-pay

## 2018-05-05 DIAGNOSIS — N6489 Other specified disorders of breast: Secondary | ICD-10-CM

## 2018-05-20 ENCOUNTER — Encounter: Payer: Self-pay | Admitting: Certified Nurse Midwife

## 2018-05-20 ENCOUNTER — Other Ambulatory Visit (HOSPITAL_COMMUNITY)
Admission: RE | Admit: 2018-05-20 | Discharge: 2018-05-20 | Disposition: A | Discharge status: Home / Self Care | Payer: BLUE CROSS/BLUE SHIELD | Source: Ambulatory Visit | Attending: Certified Nurse Midwife | Admitting: Certified Nurse Midwife

## 2018-05-20 ENCOUNTER — Ambulatory Visit (INDEPENDENT_AMBULATORY_CARE_PROVIDER_SITE_OTHER): Payer: BLUE CROSS/BLUE SHIELD | Admitting: Certified Nurse Midwife

## 2018-05-20 VITALS — BP 110/60 | HR 100 | Ht 66.0 in | Wt 160.0 lb

## 2018-05-20 DIAGNOSIS — Z01419 Encounter for gynecological examination (general) (routine) without abnormal findings: Secondary | ICD-10-CM

## 2018-05-20 DIAGNOSIS — B373 Candidiasis of vulva and vagina: Secondary | ICD-10-CM

## 2018-05-20 DIAGNOSIS — L292 Pruritus vulvae: Secondary | ICD-10-CM

## 2018-05-20 DIAGNOSIS — Z124 Encounter for screening for malignant neoplasm of cervix: Secondary | ICD-10-CM | POA: Diagnosis present

## 2018-05-20 DIAGNOSIS — B3731 Acute candidiasis of vulva and vagina: Secondary | ICD-10-CM

## 2018-05-20 MED ORDER — CLOTRIMAZOLE-BETAMETHASONE 1-0.05 % EX CREA: 1.0000 "application " | TOPICAL_CREAM | Freq: Two times a day (BID) | CUTANEOUS | 0 refills | Status: DC

## 2018-05-20 MED ORDER — FLUCONAZOLE 150 MG PO TABS: 150.0000 mg | ORAL_TABLET | Freq: Once | ORAL | 0 refills | Status: AC

## 2018-05-20 NOTE — Progress Notes (Addendum)
Gynecology Annual Exam  PCP: Mick SellFitzgerald, David P, MD  Chief Complaint:  Chief Complaint  Patient presents with  . Gynecologic Exam    Spot where lesion was is starting to flare up again    History of Present Illness: Chelsea Charles is a 47 y.o. WF, G2P2002, who  presents for her annual exam. She has been having some vulvar/perineal itching intermittently since being diagnosed with early lichen sclerosus in April 2019. She uses the clobetasol x 1-2 days with relief.   Her menses are irregular and infrequent on the Lo Loestrin. They usually last  2 days and with light flow/spotting. . She does have occasional breakthrough  bleeding.  She denies dysmenorrhea.She does have night sweats and hot flashes. Last pap smear: 05/17/2017, results were NIL.  She has a remote history of abnormal Pap smears and had a LEEP in 1998   The patient is sexually active. She currently uses BCPs for contraception.   Since her last visit, she was treated for diverticulitis and has also had a colonoscopy.   Her past medical history is remarkable for torticollis, SVT and in 2017 she had a corrective osteotomy of her left wrist.  The patient does perform self breast exams. Her last bilateral mammogram was 07/05/2017 and was Bir[facility] 3 following her breast biopsy in 01/2017. She had a stereotactic biopsy on her right breast for suspicious calcifications. The biopsy returned complex sclerosing lesion. Dr Lemar LivingsByrnett was consulted and mutual decision was made to follow with mammograms.  She had a colonoscopy 10/18/2017. Results: hyperplastic polyps x 2 and diverticulosis. Next one due in 3 years due to family history of colon polyps.  There is a family history of breast cancer in her paternal cousin. Genetic testing has not been done.  THere is a family history of ovarian cancer in her paternal grandmother.Genetic testing has not been done.  The patient denies smoking.  She denies drinking.  She denies illegal drug  use.  The patient exercises by walking the dog.. She does get adequate calcium in her diet.  The patient denies current symptoms of depression.    Review of Systems: Review of Systems  Constitutional: Negative for chills, fever and weight loss.  HENT: Positive for congestion and sinus pain. Negative for sore throat.   Eyes: Negative for blurred vision and pain.  Respiratory: Negative for cough, hemoptysis, shortness of breath and wheezing.   Cardiovascular: Negative for chest pain, palpitations and leg swelling.  Gastrointestinal: Negative for abdominal pain, blood in stool, diarrhea, heartburn, nausea and vomiting.  Genitourinary: Negative for dysuria, frequency, hematuria and urgency.       Positive for irregular menses/ oligomenorrhea  Musculoskeletal: Negative for back pain, joint pain and myalgias.  Skin: Positive for itching (vulvar) and rash (vulvar).  Neurological: Negative for dizziness, tingling and headaches.  Endo/Heme/Allergies: Positive for environmental allergies. Negative for polydipsia. Does not bruise/bleed easily.       Negative for hirsutism. Psitive for hot flashes and night sweats   Psychiatric/Behavioral: Negative for depression. The patient is not nervous/anxious and does not have insomnia.     Past Medical History:  Past Medical History:  Diagnosis Date  . Anemia   . Cervical dystonia   . Cervical high risk HPV (human papillomavirus) test positive 2003   BVD - LEEP  . Diverticulitis 2019  . Family history of ovarian cancer 05/2017   genetic testing letter sent  . History of abnormal mammogram 03/2010; 2017/18   steriotactic bx -  [facility]; again 2017/18  . Hx of degenerative disc disease   . Hypertension   . Lichen sclerosus et atrophicus 2019  . Tachycardia 2014  . Torticollis 01/2015    Past Surgical History:  Past Surgical History:  Procedure Laterality Date  . BREAST BIOPSY Right 2011   stereo- neg-by Dr. Lemar Livings  . BREAST BIOPSY Right 2018     stero-negative  . CERVICAL BIOPSY  W/ LOOP ELECTRODE EXCISION  12/11/1996  . CESAREAN SECTION  2004  . CESAREAN SECTION  2002  . CHOLECYSTECTOMY  2004  . COLONOSCOPY  2007  . COLONOSCOPY WITH PROPOFOL N/A 10/18/2017   Procedure: COLONOSCOPY WITH PROPOFOL;  Surgeon: Scot Jun, MD;  Location: [facility] ENDOSCOPY;  Service: Endoscopy;  Laterality: N/A;  . TONSILLECTOMY    . VULVA /PERINEUM BIOPSY  1999  . WRIST SURGERY Left 12/2015   corrective osteotomy    Family History:  Family History  Problem Relation Age of Onset  . Breast cancer Cousin 63       paternal second cousin  . Heart disease Mother   . Hypertension Mother   . Heart disease Father   . Hypertension Father   . Colon polyps Father   . Ovarian cancer Paternal Grandmother 2    Social History:  Social History   Socioeconomic History  . Marital status: Married    Spouse name: Not on file  . Number of children: 2  . Years of education: 94  . Highest education level: Not on file  Occupational History  . Occupation: Naval architect  . Financial resource strain: Not on file  . Food insecurity:    Worry: Not on file    Inability: Not on file  . Transportation needs:    Medical: Not on file    Non-medical: Not on file  Tobacco Use  . Smoking status: Former Smoker    Types: Cigarettes    Last attempt to quit: 06/19/1995    Years since quitting: 23.0  . Smokeless tobacco: Never Used  Substance and Sexual Activity  . Alcohol use: Yes    Comment: occasionally  . Drug use: No  . Sexual activity: Yes    Partners: Male    Birth control/protection: Pill  Lifestyle  . Physical activity:    Days per week: 3 days    Minutes per session: 30 min  . Stress: Rather much  Relationships  . Social connections:    Talks on phone: More than three times a week    Gets together: More than three times a week    Attends religious service: More than 4 times per year    Active member of club or organization: Yes     Attends meetings of clubs or organizations: More than 4 times per year    Relationship status: Married  . Intimate partner violence:    Fear of current or ex partner: No    Emotionally abused: No    Physically abused: No    Forced sexual activity: No  Other Topics Concern  . Not on file  Social History Narrative  . Not on file    Allergies:  Allergies  Allergen Reactions  . Sulfa Antibiotics Shortness Of Breath and Other (See Comments)    Elevated heart rate  . Codeine Nausea And Vomiting and Nausea Only  . Penicillins Swelling and Other (See Comments)    Childhood reaction, facial swelling  . Doxycycline Palpitations    Medications:  Current Outpatient Medications on  File Prior to Visit  Medication Sig Dispense Refill  . clobetasol ointment (TEMOVATE) 0.05 % Apply to affected area every night for 2 weeks, then three times a week 30 g 2  . ibuprofen (ADVIL,MOTRIN) 200 MG tablet Take by mouth.    . LO LOESTRIN FE 1 MG-10 MCG / 10 MCG tablet TAKE 1 TABLET BY MOUTH EVERY DAY 28 tablet 11  .      Marland Kitchen ELDERBERRY PO Take by mouth.    . Multiple Vitamins-Minerals (MULTIVITAMIN GUMMIES WOMENS PO) Take by mouth.     No current facility-administered medications on file prior to visit.   Flonase nasal spray daily prm   Physical Exam Vitals: BP 110/60   Pulse 100   Ht 5\' 6"  (1.676 m)   Wt 160 lb (72.6 kg)   LMP 05/05/2018 (Exact Date)   BMI 25.82 kg/m   General: WF in  NAD HEENT: normocephalic, anicteric Neck: no thyroid enlargement, no palpable nodules, no cervical lymphadenopathy   Neck twists to the left Pulmonary: No increased work of breathing, CTAB Cardiovascular: RRR, without murmur  Breast: Breast symmetrical, no tenderness, no palpable nodules or masses, no skin or nipple retraction present, no nipple discharge.  No axillary, infraclavicular or supraclavicular lymphadenopathy. Abdomen: Soft, non-tender, non-distended.  Umbilicus without lesions.  No hepatomegaly  or masses palpable. No evidence of hernia. Genitourinary:  External: Two parallel red lines on perineal area.  Normal urethral meatus, normal Bartholin's and Skene's glands.    Vagina: Normal vaginal mucosa, no evidence of prolapse, white discharge.    Cervix: Grossly normal in appearance, no bleeding, non-tender  Uterus: Retroflexed, normal size, shape, and consistency, immobile, and non-tender  Adnexa: No adnexal masses, non-tender  Rectal: deferred  Lymphatic: no evidence of inguinal lymphadenopathy Extremities: no edema, erythema, or tenderness Neurologic: Grossly intact Psychiatric: mood appropriate, affect full  Wet prep: positive for hyphae, negative for Trich and clue cells   Assessment: 47 y.o. [id number] annual gyn exam Perimenopausal symptoms Irregular and infrequent withdrawal on BCPs Monilial vulvovaginitis  Plan:   1) Breast cancer screening - recommend monthly self breast exam and bilateral mammogram and follow up per Dr Lemar Livings. Offered MYRISK testing due to family hx of ovarian cancer. She declines at this time.  2) Cervical cancer screening - Pap was done.  Patient opts for yearly screening interval  3) Contraception - Refill Lo Loestrin  4) Routine healthcare maintenance including cholesterol and diabetes screening managed by PCP (normal in 2017)  5) Colon cancer screen-had recent colonoscopy-next due in 3 years  6) Diflucan 150 mgm x1, Lotrisone externally BID prn.  7) RTO 1 year and prn  Farrel Conners, CNM

## 2018-05-22 LAB — CYTOLOGY - PAP
ADEQUACY: ABSENT
Diagnosis: NEGATIVE

## 2018-06-17 ENCOUNTER — Encounter: Payer: Self-pay | Admitting: Certified Nurse Midwife

## 2018-06-17 DIAGNOSIS — K5792 Diverticulitis of intestine, part unspecified, without perforation or abscess without bleeding: Secondary | ICD-10-CM | POA: Insufficient documentation

## 2018-06-17 LAB — POCT WET PREP (WET MOUNT): Trichomonas Wet Prep HPF POC: ABSENT

## 2018-07-01 ENCOUNTER — Telehealth: Payer: Self-pay | Admitting: General Surgery

## 2018-07-01 NOTE — Telephone Encounter (Signed)
Spoke with patient said she would like to wait when dr. Lemar Livings returns patient was scheduled for 07/22/2018.

## 2018-07-08 ENCOUNTER — Ambulatory Visit
Admission: RE | Admit: 2018-07-08 | Discharge: 2018-07-08 | Disposition: A | Discharge status: Home / Self Care | Payer: BLUE CROSS/BLUE SHIELD | Source: Ambulatory Visit | Attending: General Surgery | Admitting: General Surgery

## 2018-07-08 DIAGNOSIS — N6489 Other specified disorders of breast: Secondary | ICD-10-CM

## 2018-07-22 ENCOUNTER — Ambulatory Visit: Payer: BLUE CROSS/BLUE SHIELD | Admitting: General Surgery

## 2018-07-24 ENCOUNTER — Encounter: Payer: Self-pay | Admitting: General Surgery

## 2018-07-24 ENCOUNTER — Ambulatory Visit: Payer: BLUE CROSS/BLUE SHIELD | Admitting: General Surgery

## 2018-07-24 ENCOUNTER — Other Ambulatory Visit: Payer: Self-pay

## 2018-07-24 VITALS — BP 124/68 | HR 70 | Temp 97.7°F | Ht 65.0 in | Wt 156.0 lb

## 2018-07-24 DIAGNOSIS — N6489 Other specified disorders of breast: Secondary | ICD-10-CM | POA: Diagnosis not present

## 2018-07-24 NOTE — Patient Instructions (Signed)
Patient will be asked to return to the office in one year with a bilateral screening mammogram. The patient is aware to call back for any questions or concerns. 

## 2018-07-24 NOTE — Progress Notes (Signed)
Patient ID: Chelsea Charles, female   DOB: [birth year], 48 y.o.   MRN: [id number]  Chief Complaint  Patient presents with  . Follow-up    HPI Chelsea Charles is a 48 y.o. female who presents for a breast evaluation. The most recent mammogram was done on 07/08/2018 .  Patient does perform regular self breast checks and gets regular mammograms done.    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   Past Medical History:  Diagnosis Date  . Anemia   . Cervical dystonia   . Cervical high risk HPV (human papillomavirus) test positive 2003   BVD - LEEP  . Diverticulitis 2019  . Family history of ovarian cancer 05/2017   genetic testing letter sent  . History of abnormal mammogram 03/2010; 2017/18   steriotactic bx - [facility]; again 2017/18  . Hx of degenerative disc disease   . Hypertension   . Lichen sclerosus et atrophicus 2019  . Tachycardia 2014  . Torticollis 01/2015    Past Surgical History:  Procedure Laterality Date  . BREAST BIOPSY Right 2011   Fibrocystic changes  . BREAST BIOPSY Right 2018   stereo-PSEUDO-ANGIOMATOUS STROMAL HYPERPLASIA.   Marland Kitchen CERVICAL BIOPSY  W/ LOOP ELECTRODE EXCISION  12/11/1996  . CESAREAN SECTION  2004  . CESAREAN SECTION  2002  . CHOLECYSTECTOMY  2004  . COLONOSCOPY  2007  . COLONOSCOPY WITH PROPOFOL N/A 10/18/2017   Procedure: COLONOSCOPY WITH PROPOFOL;  Surgeon: Scot Jun, MD;  Location: [facility] ENDOSCOPY;  Service: Endoscopy;  Laterality: N/A;  . TONSILLECTOMY    . VULVA /PERINEUM BIOPSY  1999  . WRIST SURGERY Left 12/2015   corrective osteotomy    Family History  Problem Relation Age of Onset  . Breast cancer Cousin 40       paternal second cousin  . Heart disease Mother   . Hypertension Mother   . Heart disease Father   . Hypertension Father   . Colon polyps Father   . Ovarian cancer Paternal Grandmother 46    Social History Social History    Tobacco Use  . Smoking status: Former Smoker    Types: Cigarettes    Last attempt to quit: 06/19/1995    Years since quitting: 23.1  . Smokeless tobacco: Never Used  Substance Use Topics  . Alcohol use: Yes    Comment: occasionally  . Drug use: No    Allergies  Allergen Reactions  . Sulfa Antibiotics Shortness Of Breath and Other (See Comments)    Elevated heart rate  . Codeine Nausea And Vomiting and Nausea Only  . Penicillins Swelling and Other (See Comments)    Childhood reaction, facial swelling  . Doxycycline Palpitations    Current Outpatient Medications  Medication Sig Dispense Refill  . ELDERBERRY PO Take by mouth.    . LO LOESTRIN FE 1 MG-10 MCG / 10 MCG tablet TAKE 1 TABLET BY MOUTH EVERY DAY 28 tablet 11  . Multiple Vitamins-Minerals (MULTIVITAMIN GUMMIES WOMENS PO) Take by mouth.     No current facility-administered medications for this visit.     Review of Systems Review of Systems  Constitutional: Negative.   Respiratory: Negative.   Cardiovascular: Negative.     Blood pressure 124/68, pulse 70, temperature 97.7 F (36.5 C), temperature source Skin, height 5\' 5"  (1.651 m), weight 156 lb (70.8 kg), SpO2 98 %.  Physical Exam Physical Exam Constitutional:      Appearance:  She is well-developed.  Eyes:     General: No scleral icterus.    Conjunctiva/sclera: Conjunctivae normal.  Neck:     Musculoskeletal: Neck supple.  Cardiovascular:     Rate and Rhythm: Normal rate and regular rhythm.     Heart sounds: Normal heart sounds.  Pulmonary:     Effort: Pulmonary effort is normal.     Breath sounds: Normal breath sounds.  Chest:     Breasts:        Right: No inverted nipple, mass, nipple discharge, skin change or tenderness.        Left: No inverted nipple, mass, nipple discharge, skin change or tenderness.  Lymphadenopathy:     Cervical: No cervical adenopathy.  Skin:    General: Skin is warm and dry.  Neurological:     Mental Status: She is  alert and oriented to person, place, and time.     Data Reviewed Bilateral diagnostic mammograms dated July 08, 2018 were reviewed.  No interval change.  BI-RADS-2.  Return to screening exams.  August 02, 2016 biopsy results:  A. BREAST CALCIFICATIONS, RIGHT UPPER OUTER QUADRANT; STEREOTACTIC  BIOPSY:  - COMPLEX SCLEROSING LESION.  - COLUMNAR CELL CHANGE.  - PSEUDO-ANGIOMATOUS STROMAL HYPERPLASIA.  - SCLEROSING ADENOSIS.  - CYST FORMATION.  - CALCIFICATIONS ASSOCIATED WITH ABOVE FINDINGS.  - NEGATIVE FOR ATYPIA AND MALIGNANCY.   Assessment    Stable breast exam status post biopsy with identification of complex sclerosing lesion, no evidence of recurrent or progression.    Plan  Patient will be asked to return to the office in one year with a bilateral screening mammogram.The patient is aware to call back for any questions or concerns.  HPI, Physical Exam, Assessment and Plan have been scribed under the direction and in the presence of Donnalee Curry, MD.  Chelsea Charles, CMA  I have completed the exam and reviewed the above documentation for accuracy and completeness.  I agree with the above.  Museum/gallery conservator has been used and any errors in dictation or transcription are unintentional.  Donnalee Curry, M.D., F.A.C.S.  Chelsea Charles 07/24/2018, 4:59 PM

## 2018-08-12 IMAGING — MG MM DIGITAL DIAGNOSTIC UNILAT*R*
8 series · 8 of 8 positions shown · non-contrast
Comparison: Previous exam(s).

CLINICAL DATA: Callback from screening mammogram

EXAM:
DIGITAL DIAGNOSTIC RIGHT MAMMOGRAM WITH CAD

[R CC (1 of 4)]
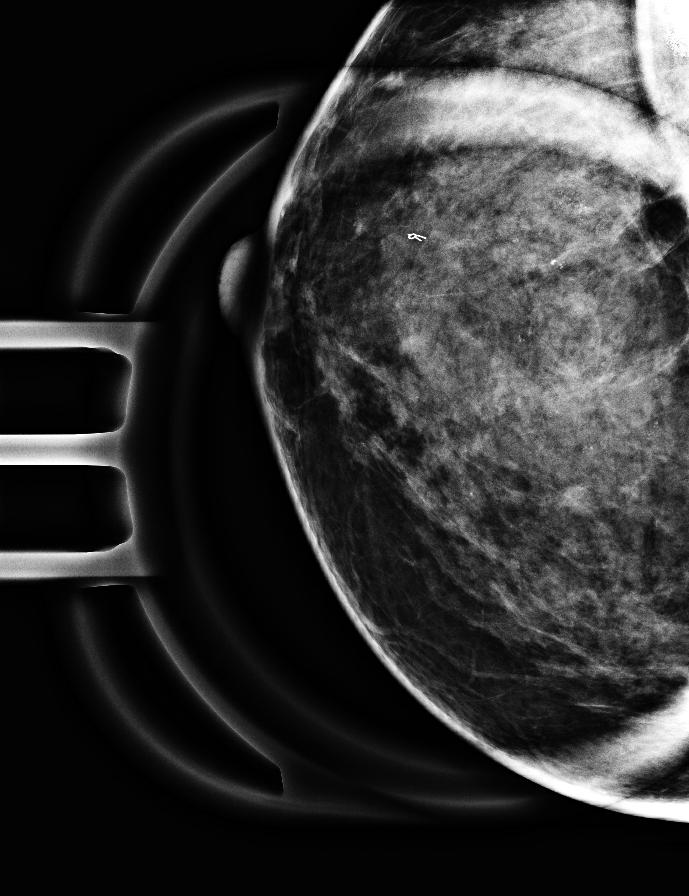

[R ML (1 of 4)]
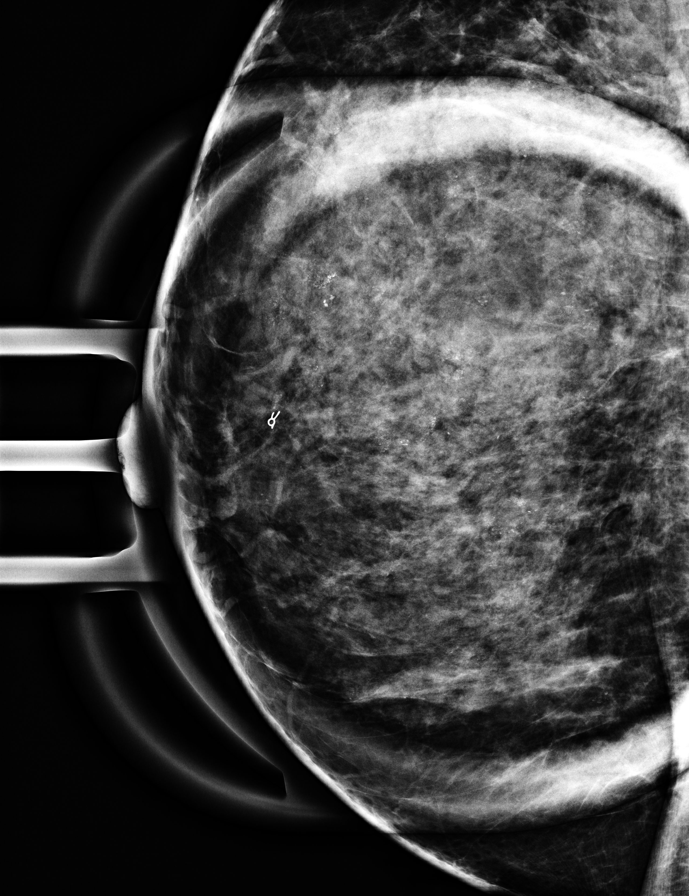

[R CC (2 of 4)]
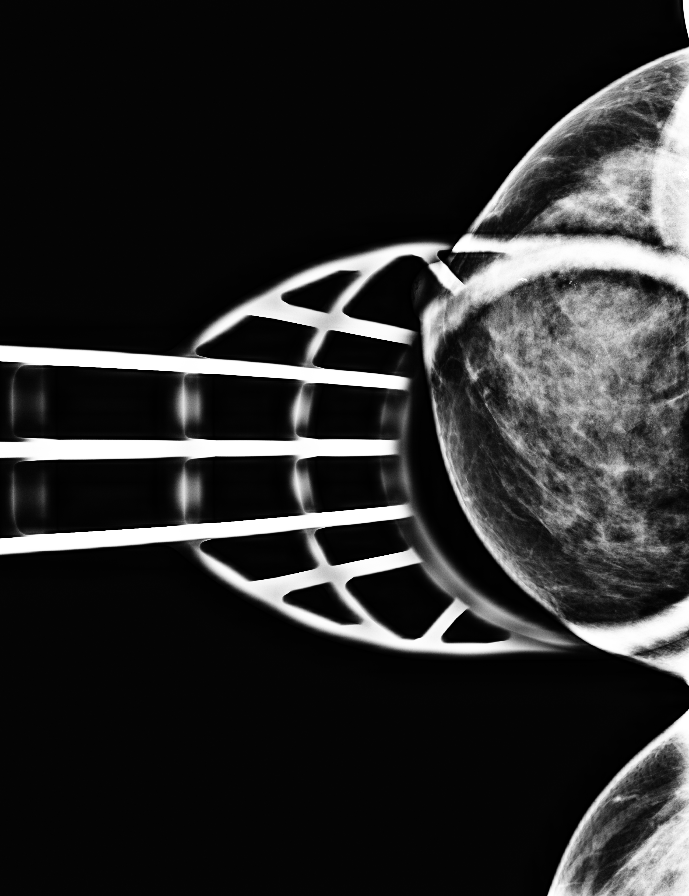

[R ML (2 of 4)]
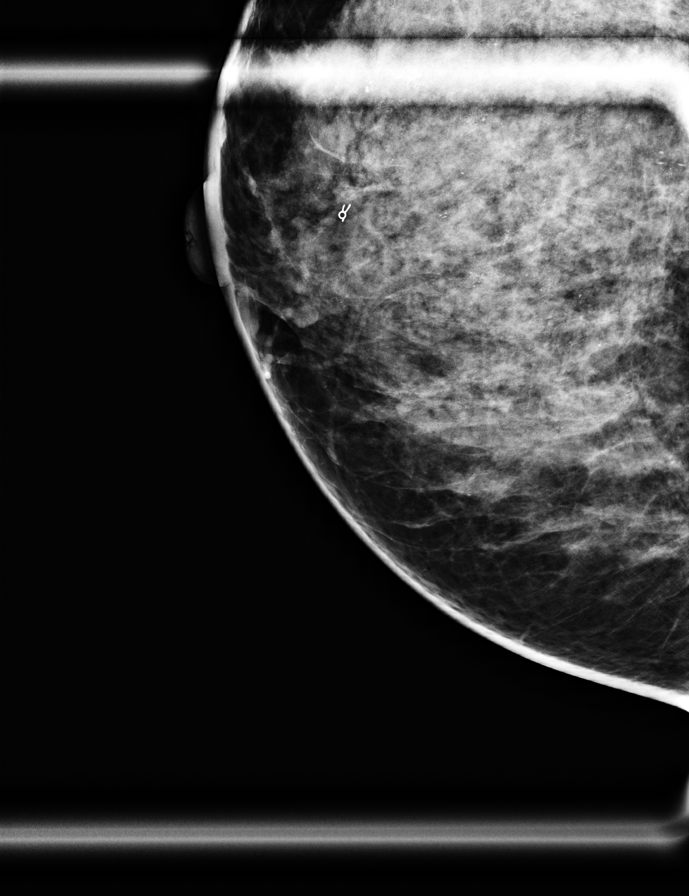

[R CC (3 of 4)]
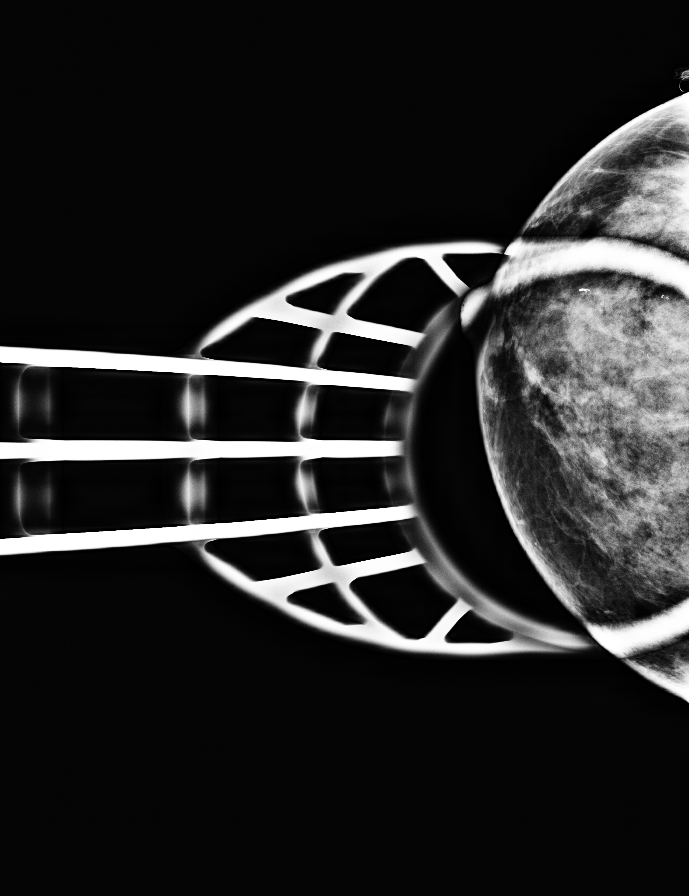

[R CC (4 of 4)]
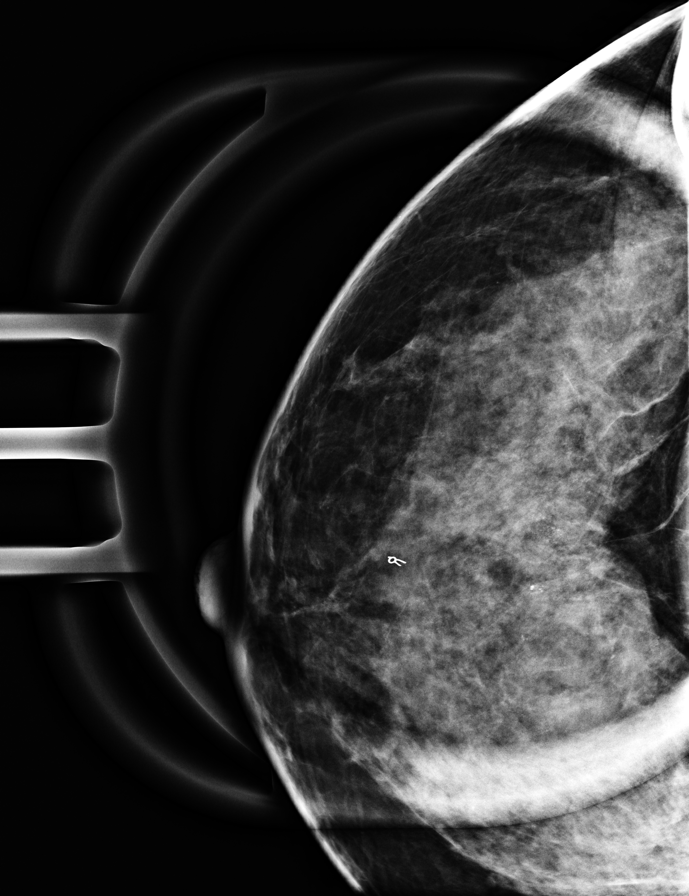

[R ML (3 of 4)]
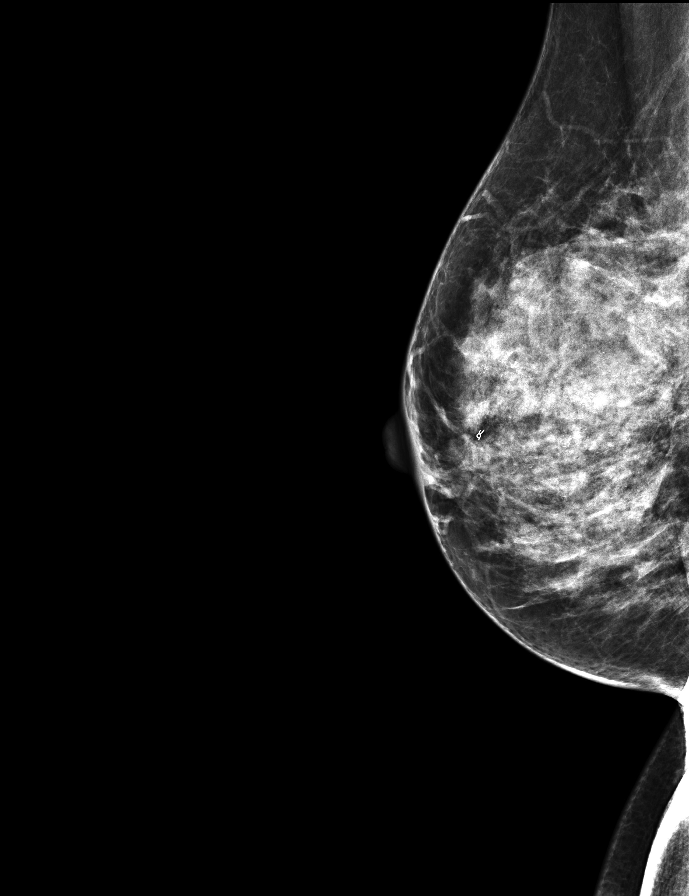

[R ML (4 of 4)]
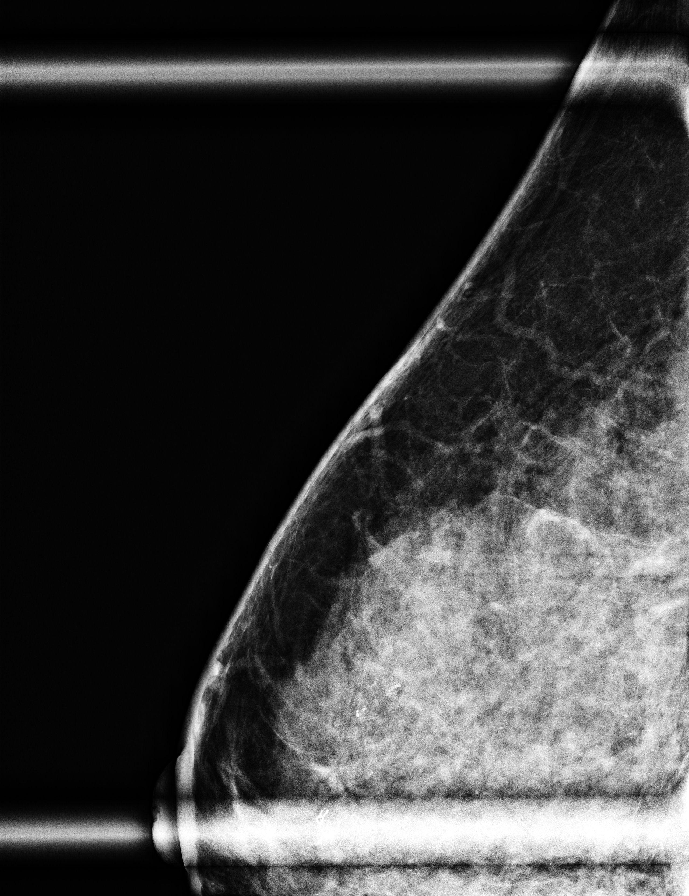

[8 of 8 positions shown; findings below may reference images not displayed]

ACR Breast Density Category c: The breast tissue is heterogeneously
dense, which may obscure small masses.
FINDINGS: On the additional views, there are amorphous and pleomorphic
calcification throughout the right breast spanning at least 8.8 cm.
Some of these calcifications definitively layer on the lateral view
consistent with milk of calcium; however, there are additional
calcifications which do not demonstrate layering and are
indeterminate.

Mammographic images were processed with CAD.
IMPRESSION: Indeterminate right breast calcifications.

RECOMMENDATION:
Stereotactic guided biopsy of the indeterminate calcifications is
recommended.

I have discussed the findings and recommendations with the patient.
Results were also provided in writing at the conclusion of the
visit. If applicable, a reminder letter will be sent to the patient
regarding the next appointment.

BI-RADS CATEGORY  4: Suspicious.

## 2019-01-13 ENCOUNTER — Encounter: Payer: Self-pay | Admitting: General Surgery

## 2019-02-17 ENCOUNTER — Telehealth: Payer: Self-pay

## 2019-02-17 NOTE — Telephone Encounter (Signed)
Patient has seen [facility] for some issues with yeast infection and lesions. Patient states the lesions have returned. She used the medication for yeast infection, but it didn't seem to help. She had another medication that she was taking for a different type of issue. She wasn't sure if she needed to take that or if she needed an appointment. [phone] (home) if her cell phone is busy it's bc she is working from home.

## 2019-02-17 NOTE — Telephone Encounter (Signed)
Spoke w/patient. She had been using the wrong cream for the lichen sclerosis. She still has Clobetasol cream (it's out of date) but she will try it and let us know if it doesn't help & she needs a new rx or to be seen to be sure that's what it is.

## 2019-02-20 ENCOUNTER — Other Ambulatory Visit: Payer: Self-pay | Admitting: Certified Nurse Midwife

## 2019-02-20 MED ORDER — CLOBETASOL PROPIONATE 0.05 % EX OINT: TOPICAL_OINTMENT | CUTANEOUS | 1 refills | Status: DC

## 2019-02-20 NOTE — Telephone Encounter (Signed)
Spoke with patient today. Clobetasol is relieving her discomfort, but her ointment is expired. Will call in new RX.

## 2019-03-03 ENCOUNTER — Telehealth: Payer: Self-pay

## 2019-03-03 NOTE — Telephone Encounter (Signed)
Pt called triage reporting her Clobetsol Ointment is expired , she still has the pain and lesions should she be seen or can another RX be sent in, Please advise

## 2019-03-03 NOTE — Telephone Encounter (Signed)
If the clobetasol is not helping, have her come in to see me tomorrow. I did call in a RX for clobetasol 9/4 to replace her expired ointment.

## 2019-03-03 NOTE — Telephone Encounter (Signed)
Pt aware.

## 2019-04-10 ENCOUNTER — Other Ambulatory Visit: Payer: Self-pay | Admitting: Certified Nurse Midwife

## 2019-05-25 ENCOUNTER — Other Ambulatory Visit: Payer: Self-pay | Admitting: General Surgery

## 2019-05-25 ENCOUNTER — Other Ambulatory Visit: Payer: Self-pay

## 2019-05-25 ENCOUNTER — Telehealth: Payer: Self-pay

## 2019-05-25 DIAGNOSIS — Z1231 Encounter for screening mammogram for malignant neoplasm of breast: Secondary | ICD-10-CM

## 2019-05-25 NOTE — Telephone Encounter (Signed)
Patient will follow up with Dr.Byrnett after her mammogram in January 2021.

## 2019-05-26 ENCOUNTER — Other Ambulatory Visit: Payer: Self-pay | Admitting: General Surgery

## 2019-05-26 DIAGNOSIS — N6489 Other specified disorders of breast: Secondary | ICD-10-CM

## 2019-05-26 NOTE — Progress Notes (Signed)
Gynecology Annual Exam  PCP: Mick Sell, MD  Chief Complaint:  Chief Complaint  Patient presents with  . Gynecologic Exam    Lesions - ?same one that was bx'd coming back or a new one; using clobetasol   History of Present Illness: Chelsea Charles is a 48 y.o. WF, G2P2002, who  presents for her annual exam. She is concerned that her lichen sclerosus is flaring. Does not have itching, but feels a bump near her introitus. She is applying clobetasol ointment 2 times a week.  Her menses are irregular and infrequent on the Lo Loestrin. Her LMP was 11/23 to 11/26.  They usually last 3 days and with light flow/spotting although her October menses lasted 7 days. . She denies breakthrough  bleeding.  She denies dysmenorrhea. She does have night sweats and hot flashes. Last pap smear: 05/20/2018 , results were NIL.  She has a remote history of abnormal Pap smears and had a LEEP in 1998   The patient is sexually active. She currently uses BCPs for contraception.   Since her last visit 05/20/2018, she has had no significant changes in her health.  Her past medical history is remarkable for torticollis, SVT and in 2017 she had a corrective osteotomy of her left wrist.  The patient does perform self breast exams. Her last bilateral mammogram was 07/08/2018 and was Bir[facility] 2. She is followed by Dr Lemar Livings since her breast biopsy in 01/2017. She had a stereotactic biopsy on her right breast for suspicious calcifications. The biopsy returned complex sclerosing lesion. Dr Lemar Livings was consulted and mutual decision was made to follow with mammograms. Next one is scheduled for 07/14/2019  She had a colonoscopy 10/18/2017. Results: hyperplastic polyps x 2 and diverticulosis. Next one due in 3 years due to family history of colon polyps.  There is a family history of breast cancer in her paternal cousin. Genetic testing has not been done.  THere is a family history of ovarian cancer in her paternal  grandmother.Genetic testing has not been done.  The patient denies smoking.  She drinks alcohol occasionally..  She denies illegal drug use.  The patient exercises by walking the dog.. She does get adequate calcium in her diet.  The patient denies current symptoms of depression.    Review of Systems: Review of Systems  Constitutional: Negative for chills, fever and weight loss.  HENT: Positive for congestion. Negative for sinus pain and sore throat.   Eyes: Negative for blurred vision and pain.  Respiratory: Negative for cough, hemoptysis, shortness of breath and wheezing.   Cardiovascular: Negative for chest pain, palpitations and leg swelling.  Gastrointestinal: Negative for abdominal pain, blood in stool, diarrhea, heartburn, nausea and vomiting.  Genitourinary: Negative for dysuria, frequency, hematuria and urgency.       Positive for irregular menses/ oligomenorrhea  Musculoskeletal: Negative for back pain, joint pain and myalgias.  Skin: Positive for rash (vulvar). Negative for itching (vulvar).  Neurological: Negative for dizziness, tingling and headaches.  Endo/Heme/Allergies: Positive for environmental allergies. Negative for polydipsia. Does not bruise/bleed easily.       Negative for hirsutism. Positive for hot flashes and night sweats   Psychiatric/Behavioral: Negative for depression. The patient is not nervous/anxious and does not have insomnia.     Past Medical History:  Past Medical History:  Diagnosis Date  . Anemia   . Cervical dystonia   . Cervical high risk HPV (human papillomavirus) test positive 2003   BVD -  LEEP  . Diverticulitis 2019  . Family history of ovarian cancer 05/2017   genetic testing letter sent  . History of abnormal mammogram 03/2010; 2017/18   steriotactic bx - [facility]; again 2017/18  . Hx of degenerative disc disease   . Hypertension   . Lichen sclerosus et atrophicus 2019  . Tachycardia 2014  . Torticollis 01/2015    Past  Surgical History:  Past Surgical History:  Procedure Laterality Date  . BREAST BIOPSY Right 2011   Fibrocystic changes  . BREAST BIOPSY Right 2018   stereo-PSEUDO-ANGIOMATOUS STROMAL HYPERPLASIA.   Marland Kitchen. CERVICAL BIOPSY  W/ LOOP ELECTRODE EXCISION  12/11/1996  . CESAREAN SECTION  2004  . CESAREAN SECTION  2002  . CHOLECYSTECTOMY  2004  . COLONOSCOPY  2007  . COLONOSCOPY WITH PROPOFOL N/A 10/18/2017   Procedure: COLONOSCOPY WITH PROPOFOL;  Surgeon: Scot JunElliott, Robert T, MD;  Location: [facility] ENDOSCOPY;  Service: Endoscopy;  Laterality: N/A;  . TONSILLECTOMY    . VULVA /PERINEUM BIOPSY  1999  . WRIST SURGERY Left 12/2015   corrective osteotomy    Family History:  Family History  Problem Relation Age of Onset  . Breast cancer Cousin 7060       paternal second cousin  . Heart disease Mother   . Hypertension Mother   . Heart disease Father   . Hypertension Father   . Colon polyps Father   . Ovarian cancer Paternal Grandmother [id number]    Social History:  Social History   Socioeconomic History  . Marital status: Married    Spouse name: Not on file  . Number of children: 2  . Years of education: 2116  . Highest education level: Not on file  Occupational History  . Occupation: JOURNALIST  Tobacco Use  . Smoking status: Former Smoker    Types: Cigarettes    Quit date: 06/19/1995    Years since quitting: 23.9  . Smokeless tobacco: Never Used  Substance and Sexual Activity  . Alcohol use: Yes    Comment: occasionally  . Drug use: No  . Sexual activity: Yes    Partners: Male    Birth control/protection: Pill  Other Topics Concern  . Not on file  Social History Narrative  . Not on file   Social Determinants of Health   Financial Resource Strain:   . Difficulty of Paying Living Expenses: Not on file  Food Insecurity:   . Worried About Programme researcher, broadcasting/film/videounning Out of Food in the Last Year: Not on file  . Ran Out of Food in the Last Year: Not on file  Transportation Needs:   . Lack of Transportation  (Medical): Not on file  . Lack of Transportation (Non-Medical): Not on file  Physical Activity:   . Days of Exercise per Week: Not on file  . Minutes of Exercise per Session: Not on file  Stress:   . Feeling of Stress : Not on file  Social Connections:   . Frequency of Communication with Friends and Family: Not on file  . Frequency of Social Gatherings with Friends and Family: Not on file  . Attends Religious Services: Not on file  . Active Member of Clubs or Organizations: Not on file  . Attends BankerClub or Organization Meetings: Not on file  . Marital Status: Not on file  Intimate Partner Violence:   . Fear of Current or Ex-Partner: Not on file  . Emotionally Abused: Not on file  . Physically Abused: Not on file  . Sexually Abused: Not on file  Allergies:  Allergies  Allergen Reactions  . Sulfa Antibiotics Shortness Of Breath and Other (See Comments)    Elevated heart rate  . Codeine Nausea And Vomiting and Nausea Only  . Penicillins Swelling and Other (See Comments)    Childhood reaction, facial swelling  . Doxycycline Palpitations    Medications:  Current Outpatient Medications:  .  acetaminophen (TYLENOL) 325 MG tablet, Take 2 tablets by mouth as needed., Disp: , Rfl:  .  clobetasol ointment (TEMOVATE) 0.05 %, Apply to affected area every night for 2-4 weeks, then three times a week x 4 weeks and then twice a week for 4 weeks or until resolution., Disp: 30 g, Rfl: 1 .  Cyanocobalamin (VITAMIN B 12) 500 MCG TABS, Take 2 tablets by mouth daily., Disp: , Rfl:  .  ELDERBERRY PO, Take by mouth., Disp: , Rfl:  .  fluticasone (FLONASE) 50 MCG/ACT nasal spray, Place 1 spray into both nostrils as needed for allergies or rhinitis., Disp: , Rfl:  .  Multiple Vitamins-Minerals (MULTIVITAMIN GUMMIES WOMENS PO), Take by mouth., Disp: , Rfl:  .  Norethindrone-Ethinyl Estradiol-Fe Biphas (LO LOESTRIN FE) 1 MG-10 MCG / 10 MCG tablet, Take 1 tablet by mouth daily., Disp: 84 tablet, Rfl: 3 .   Probiotic Product (PROBIOTIC-10) CHEW, Chew 2 tablets by mouth daily., Disp: , Rfl:   Physical Exam Vitals: BP 126/80   Pulse 87   Temp (!) 96.5 F (35.8 C)   Ht 5\' 5"  (1.651 m)   Wt 167 lb (75.8 kg)   LMP 05/11/2019 (Exact Date)   BMI 27.79 kg/m   General: WF in  NAD HEENT: normocephalic, anicteric Neck: no thyroid enlargement, no palpable nodules, no cervical lymphadenopathy   Neck twists to the left Pulmonary: No increased work of breathing, CTAB Cardiovascular: RRR, without murmur  Breast: Breast symmetrical, no tenderness, no palpable nodules or masses, no skin or nipple retraction present, no nipple discharge.  No axillary, infraclavicular or supraclavicular lymphadenopathy. Abdomen: Soft, non-tender, non-distended.  Umbilicus without lesions.  No hepatomegaly or masses palpable. No evidence of hernia. Genitourinary:  External: No whitening or thinning of tissue. Tiny flesh colored 51mm papule to right of introitus in area of biopsy last year.   Normal urethral meatus, normal Bartholin's and Skene's glands.  Lentigo present  Vagina: Normal vaginal mucosa, no evidence of prolapse, white discharge.    Cervix: Grossly normal in appearance, no bleeding, non-tender  Uterus: Retroflexed, normal size, shape, and consistency, immobile, and non-tender  Adnexa: No adnexal masses, non-tender  Rectal: deferred  Lymphatic: no evidence of inguinal lymphadenopathy Extremities: no edema, erythema, or tenderness Neurologic: Grossly intact Psychiatric: mood appropriate, affect full     Assessment: 48 y.o. [id number] annual gyn exam Perimenopausal symptoms Irregular and infrequent withdrawal on BCPs Hx of lichen sclerosus-no evidence of flare at this time.   Plan:   1) Breast cancer screening - recommend monthly self breast exam and bilateral mammogram and follow up per Dr Bary Castilla. Offered MYRISK testing due to family hx of ovarian cancer last year and patient declined.Marland Kitchen  2) Cervical  cancer screening - Pap was done.  Patient opts for yearly screening interval  3) Contraception - Refill Lo Loestrin  4) Routine healthcare maintenance including cholesterol and diabetes screening managed by PCP (normal in 2017)  5) Colon cancer screen-had  Colonoscopy in 10/2017-next due in 10/2020  6) RTO 1 year and prn  Dalia Heading, CNM

## 2019-05-27 ENCOUNTER — Ambulatory Visit: Payer: BLUE CROSS/BLUE SHIELD | Admitting: Certified Nurse Midwife

## 2019-05-27 ENCOUNTER — Ambulatory Visit (INDEPENDENT_AMBULATORY_CARE_PROVIDER_SITE_OTHER): Payer: 59 | Admitting: Certified Nurse Midwife

## 2019-05-27 ENCOUNTER — Encounter: Payer: Self-pay | Admitting: Certified Nurse Midwife

## 2019-05-27 ENCOUNTER — Other Ambulatory Visit: Payer: Self-pay

## 2019-05-27 ENCOUNTER — Other Ambulatory Visit (HOSPITAL_COMMUNITY)
Admission: RE | Admit: 2019-05-27 | Discharge: 2019-05-27 | Disposition: A | Discharge status: Home / Self Care | Payer: 59 | Source: Ambulatory Visit | Attending: Certified Nurse Midwife | Admitting: Certified Nurse Midwife

## 2019-05-27 VITALS — BP 126/80 | HR 87 | Temp 96.5°F | Ht 65.0 in | Wt 167.0 lb

## 2019-05-27 DIAGNOSIS — Z124 Encounter for screening for malignant neoplasm of cervix: Secondary | ICD-10-CM | POA: Insufficient documentation

## 2019-05-27 DIAGNOSIS — N951 Menopausal and female climacteric states: Secondary | ICD-10-CM

## 2019-05-27 DIAGNOSIS — Z01419 Encounter for gynecological examination (general) (routine) without abnormal findings: Secondary | ICD-10-CM

## 2019-05-27 MED ORDER — LO LOESTRIN FE 1 MG-10 MCG / 10 MCG PO TABS: 1.0000 | ORAL_TABLET | Freq: Every day | ORAL | 3 refills | Status: DC

## 2019-05-29 LAB — CYTOLOGY - PAP
Adequacy: ABSENT
Comment: NEGATIVE
Diagnosis: NEGATIVE
High risk HPV: NEGATIVE

## 2019-06-01 ENCOUNTER — Encounter: Payer: Self-pay | Admitting: Certified Nurse Midwife

## 2019-07-14 ENCOUNTER — Ambulatory Visit
Admission: RE | Admit: 2019-07-14 | Discharge: 2019-07-14 | Disposition: A | Discharge status: Home / Self Care | Payer: 59 | Source: Ambulatory Visit | Attending: General Surgery | Admitting: General Surgery

## 2019-07-14 DIAGNOSIS — Z1231 Encounter for screening mammogram for malignant neoplasm of breast: Secondary | ICD-10-CM | POA: Diagnosis present

## 2019-08-23 ENCOUNTER — Ambulatory Visit: Payer: 59 | Attending: Internal Medicine

## 2019-08-23 DIAGNOSIS — Z23 Encounter for immunization: Secondary | ICD-10-CM | POA: Insufficient documentation

## 2019-08-23 NOTE — Progress Notes (Signed)
   Covid-19 Vaccination Clinic  Name:  Chelsea Charles    MRN: [id number] DOB: March 16, 1971  08/23/2019  [name] was observed post Covid-19 immunization for 15 minutes without incident. She was provided with Vaccine Information Sheet and instruction to access the V-Safe system.   [name] was instructed to call 911 with any severe reactions post vaccine: Marland Kitchen Difficulty breathing  . Swelling of face and throat  . A fast heartbeat  . A bad rash all over body  . Dizziness and weakness   Immunizations Administered    Name Date Dose VIS Date Route   Pfizer COVID-19 Vaccine 08/23/2019  4:42 PM 0.3 mL 05/29/2019 Intramuscular   Manufacturer: [employer], [employer]   Lot: [id number]   NDC: [address]-9

## 2019-09-15 ENCOUNTER — Ambulatory Visit: Payer: 59 | Attending: Internal Medicine

## 2019-09-15 DIAGNOSIS — Z23 Encounter for immunization: Secondary | ICD-10-CM

## 2019-09-15 NOTE — Progress Notes (Signed)
   Covid-19 Vaccination Clinic  Name:  Chelsea Charles    MRN: [id number] DOB: 1970-10-08  09/15/2019  Ms. [name] was observed post Covid-19 immunization for 15 minutes without incident. She was provided with Vaccine Information Sheet and instruction to access the V-Safe system.   Ms. [name] was instructed to call 911 with any severe reactions post vaccine: Marland Kitchen Difficulty breathing  . Swelling of face and throat  . A fast heartbeat  . A bad rash all over body  . Dizziness and weakness   Immunizations Administered    Name Date Dose VIS Date Route   Pfizer COVID-19 Vaccine 09/15/2019  4:30 PM 0.3 mL 05/29/2019 Intramuscular   Manufacturer: [employer], [employer]   Lot: [id number]   NDC: [address]-1

## 2020-05-30 DIAGNOSIS — Z8041 Family history of malignant neoplasm of ovary: Secondary | ICD-10-CM | POA: Insufficient documentation

## 2020-05-30 NOTE — Progress Notes (Signed)
PCP: Mick Sell, MD   Chief Complaint  Patient presents with  . Gynecologic Exam    HPI:      [name] is a 49 y.o. G2P2002 whose LMP was No LMP recorded. (Menstrual status: Perimenopausal)., presents today for her annual examination.  Her menses are infrquent with Lo Lo OCPs, with a few days of spotting only. No BTB, no dysmen. Has night sweats.   Sex activity: single partner, contraception - OCP (estrogen/progesterone) but not recently due to husband health issue.  Hx of LS, treats with clobetasol but hasn't had to use in about a yr  Last Pap: 05/27/19  Results were: no abnormalities /neg HPV DNA.  Hx of STDs: HPV on pap;  S/p LEEP 1998  Last mammogram: 07/14/19  Results were: normal--routine follow-up in 12 months S/p stereotactic biopsy on her right breast for suspicious calcifications;  Returned as complex sclerosing lesion. There is a FH of breast cancer in her pat 2nd cousin. There is a FH of ovarian cancer in her PGM, genetic testing not done. The patient does not do self-breast exams.  Colonoscopy: 2019 with polyps;  Repeat due after 3 years.   Tobacco use: The patient denies current or previous tobacco use. Alcohol use: social drinker  No drug use Exercise: very active  She does get adequate calcium and Vitamin D in her diet.  Labs with PCP.   Past Medical History:  Diagnosis Date  . Anemia   . Cervical dystonia   . Cervical high risk HPV (human papillomavirus) test positive 2003   BVD - LEEP  . Diverticulitis 2019  . Family history of ovarian cancer 05/2017   genetic testing letter sent  . History of abnormal mammogram 03/2010; 2017/18   steriotactic bx - [facility]; again 2017/18  . Hx of degenerative disc disease   . Hypertension   . Lichen sclerosus et atrophicus 2019  . Tachycardia 2014  . Torticollis 01/2015    Past Surgical History:  Procedure Laterality Date  . BREAST BIOPSY Right 2011   Fibrocystic changes with ribbon marker   . BREAST BIOPSY Right 2018   stereo with dumbell marker, complex sclerosing lesion, columnar cell change, PASH, sclerosing adenosis, no atypia or malignancy, no excision  . CERVICAL BIOPSY  W/ LOOP ELECTRODE EXCISION  12/11/1996  . CESAREAN SECTION  2004  . CESAREAN SECTION  2002  . CHOLECYSTECTOMY  2004  . COLONOSCOPY  2007  . COLONOSCOPY WITH PROPOFOL N/A 10/18/2017   Procedure: COLONOSCOPY WITH PROPOFOL;  Surgeon: Scot Jun, MD;  Location: [facility] ENDOSCOPY;  Service: Endoscopy;  Laterality: N/A;  . TONSILLECTOMY    . VULVA /PERINEUM BIOPSY  1999  . WRIST SURGERY Left 12/2015   corrective osteotomy    Family History  Problem Relation Age of Onset  . Breast cancer Cousin 70       paternal second cousin  . Heart disease Mother   . Hypertension Mother   . Heart disease Father   . Hypertension Father   . Colon polyps Father   . Ovarian cancer Paternal Grandmother 65    Social History   Socioeconomic History  . Marital status: Married    Spouse name: Not on file  . Number of children: 2  . Years of education: 62  . Highest education level: Not on file  Occupational History  . Occupation: JOURNALIST  Tobacco Use  . Smoking status: Former Smoker    Types: Cigarettes    Quit date:  06/19/1995    Years since quitting: 24.9  . Smokeless tobacco: Never Used  Vaping Use  . Vaping Use: Never used  Substance and Sexual Activity  . Alcohol use: Yes    Comment: occasionally  . Drug use: No  . Sexual activity: Not Currently    Partners: Male    Birth control/protection: Pill  Other Topics Concern  . Not on file  Social History Narrative  . Not on file   Social Determinants of Health   Financial Resource Strain: Not on file  Food Insecurity: Not on file  Transportation Needs: Not on file  Physical Activity: Not on file  Stress: Not on file  Social Connections: Not on file  Intimate Partner Violence: Not on file     Current Outpatient Medications:  .   acetaminophen (TYLENOL) 325 MG tablet, Take 2 tablets by mouth as needed., Disp: , Rfl:  .  Cyanocobalamin (VITAMIN B 12) 500 MCG TABS, Take 2 tablets by mouth daily., Disp: , Rfl:  .  ELDERBERRY PO, Take by mouth., Disp: , Rfl:  .  fluticasone (FLONASE) 50 MCG/ACT nasal spray, Place 1 spray into both nostrils as needed for allergies or rhinitis., Disp: , Rfl:  .  loratadine (CLARITIN REDITABS) 10 MG dissolvable tablet, Take by mouth., Disp: , Rfl:  .  Multiple Vitamins-Minerals (MULTIVITAMIN GUMMIES WOMENS PO), Take by mouth., Disp: , Rfl:  .  Norethindrone-Ethinyl Estradiol-Fe Biphas (LO LOESTRIN FE) 1 MG-10 MCG / 10 MCG tablet, Take 1 tablet by mouth daily., Disp: 84 tablet, Rfl: 3 .  Probiotic Product (PROBIOTIC-10) CHEW, Chew 2 tablets by mouth daily., Disp: , Rfl:      ROS:  Review of Systems  Constitutional: Negative for fatigue, fever and unexpected weight change.  Respiratory: Negative for cough, shortness of breath and wheezing.   Cardiovascular: Negative for chest pain, palpitations and leg swelling.  Gastrointestinal: Negative for blood in stool, constipation, diarrhea, nausea and vomiting.  Endocrine: Negative for cold intolerance, heat intolerance and polyuria.  Genitourinary: Negative for dyspareunia, dysuria, flank pain, frequency, genital sores, hematuria, menstrual problem, pelvic pain, urgency, vaginal bleeding, vaginal discharge and vaginal pain.  Musculoskeletal: Negative for back pain, joint swelling and myalgias.  Skin: Negative for rash.  Neurological: Negative for dizziness, syncope, light-headedness, numbness and headaches.  Hematological: Negative for adenopathy.  Psychiatric/Behavioral: Negative for agitation, confusion, sleep disturbance and suicidal ideas. The patient is not nervous/anxious.    BREAST: No symptoms    Objective: BP 120/70   Ht 5\' 4"  (1.626 m)   Wt 163 lb (73.9 kg)   BMI 27.98 kg/m    Physical Exam Constitutional:      Appearance:  She is well-developed.  Genitourinary:     Vulva, vagina, cervix, uterus, right adnexa and left adnexa normal.     No labial lesion or tenderness noted.     No vaginal discharge, erythema or tenderness.      Right Adnexa: not tender and no mass present.    Left Adnexa: not tender and no mass present.    No cervical polyp.     Uterus is not enlarged or tender.  Breasts:     Right: No mass, nipple discharge, skin change or tenderness.     Left: No mass, nipple discharge, skin change or tenderness.    Neck:     Thyroid: No thyromegaly.  Cardiovascular:     Rate and Rhythm: Normal rate and regular rhythm.     Heart sounds: Normal heart sounds. No  murmur heard.   Pulmonary:     Effort: Pulmonary effort is normal.     Breath sounds: Normal breath sounds.  Abdominal:     Palpations: Abdomen is soft.     Tenderness: There is no abdominal tenderness. There is no guarding.  Musculoskeletal:        General: Normal range of motion.     Cervical back: Normal range of motion.  Neurological:     General: No focal deficit present.     Mental Status: She is alert and oriented to person, place, and time.     Cranial Nerves: No cranial nerve deficit.  Skin:    General: Skin is warm and dry.  Psychiatric:        Mood and Affect: Mood normal.        Behavior: Behavior normal.        Thought Content: Thought content normal.        Judgment: Judgment normal.  Vitals reviewed.     Assessment/Plan:  Encounter for annual routine gynecological examination  Perimenopause--d/c OCPs for now to see what her cycles do. Will RF OCPs prn.  Encounter for screening mammogram for malignant neoplasm of breast - Plan: MM 3D SCREEN BREAST BILATERAL; pt to sched mammo  Family history of ovarian cancer--MyRisk testing discussed and pt to consider. Handout given. Will f/u if desires labs.  Lichen sclerosus et atrophicus--no sx currently. Will resume clobetasol prn.           GYN counsel breast self  exam, mammography screening, menopause, adequate intake of calcium and vitamin D, diet and exercise    F/U  Return in about 1 year (around 05/31/2021).  Chelsea Mckibben B. Breslyn Abdo, PA-C 05/31/2020 9:04 AM

## 2020-05-31 ENCOUNTER — Encounter: Payer: Self-pay | Admitting: Obstetrics and Gynecology

## 2020-05-31 ENCOUNTER — Ambulatory Visit (INDEPENDENT_AMBULATORY_CARE_PROVIDER_SITE_OTHER): Payer: 59 | Admitting: Obstetrics and Gynecology

## 2020-05-31 ENCOUNTER — Other Ambulatory Visit: Payer: Self-pay

## 2020-05-31 VITALS — BP 120/70 | Ht 64.0 in | Wt 163.0 lb

## 2020-05-31 DIAGNOSIS — Z1231 Encounter for screening mammogram for malignant neoplasm of breast: Secondary | ICD-10-CM

## 2020-05-31 DIAGNOSIS — L900 Lichen sclerosus et atrophicus: Secondary | ICD-10-CM

## 2020-05-31 DIAGNOSIS — Z8041 Family history of malignant neoplasm of ovary: Secondary | ICD-10-CM

## 2020-05-31 DIAGNOSIS — N951 Menopausal and female climacteric states: Secondary | ICD-10-CM

## 2020-05-31 DIAGNOSIS — Z01419 Encounter for gynecological examination (general) (routine) without abnormal findings: Secondary | ICD-10-CM | POA: Diagnosis not present

## 2020-05-31 NOTE — Patient Instructions (Signed)
I value your feedback and entrusting us with your care. If you get a [facility] patient survey, I would appreciate you taking the time to let us know about your experience today. Thank you! ° °As of May 28, 2019, your lab results will be released to your MyChart immediately, before I even have a chance to see them. Please give me time to review them and contact you if there are any abnormalities. Thank you for your patience.  ° °[facility]: [phone] ° ° ° °

## 2020-07-14 ENCOUNTER — Other Ambulatory Visit: Payer: Self-pay

## 2020-07-14 ENCOUNTER — Ambulatory Visit
Admission: RE | Admit: 2020-07-14 | Discharge: 2020-07-14 | Disposition: A | Discharge status: Home / Self Care | Payer: 59 | Source: Ambulatory Visit | Attending: Obstetrics and Gynecology | Admitting: Obstetrics and Gynecology

## 2020-07-14 DIAGNOSIS — Z1231 Encounter for screening mammogram for malignant neoplasm of breast: Secondary | ICD-10-CM | POA: Diagnosis not present

## 2020-07-21 ENCOUNTER — Telehealth: Payer: Self-pay

## 2020-07-21 NOTE — Telephone Encounter (Signed)
Pt calling; recently taken off Lo Loestrin FE; no period for a couple of yrs; had period a week ago for a full week; started spotting Tues of this week and is still having period today; has some heavy flow; normal?  [phone]

## 2020-07-22 NOTE — Telephone Encounter (Signed)
Can be normal for perimenopause. We can watch to see what her cycles do vs restart OCPs for another year. Lo Loestrin stopped 12/21.

## 2020-07-25 NOTE — Telephone Encounter (Signed)
Pt aware.

## 2021-03-28 ENCOUNTER — Other Ambulatory Visit: Payer: Self-pay

## 2021-03-28 ENCOUNTER — Encounter: Payer: Self-pay | Admitting: Obstetrics and Gynecology

## 2021-03-28 ENCOUNTER — Ambulatory Visit (INDEPENDENT_AMBULATORY_CARE_PROVIDER_SITE_OTHER): Payer: Managed Care, Other (non HMO) | Admitting: Obstetrics and Gynecology

## 2021-03-28 VITALS — BP 120/70 | Ht 65.0 in | Wt 172.0 lb

## 2021-03-28 DIAGNOSIS — R399 Unspecified symptoms and signs involving the genitourinary system: Secondary | ICD-10-CM

## 2021-03-28 DIAGNOSIS — L900 Lichen sclerosus et atrophicus: Secondary | ICD-10-CM

## 2021-03-28 DIAGNOSIS — N898 Other specified noninflammatory disorders of vagina: Secondary | ICD-10-CM | POA: Diagnosis not present

## 2021-03-28 LAB — POCT URINALYSIS DIPSTICK
Bilirubin, UA: NEGATIVE
Glucose, UA: NEGATIVE
Ketones, UA: NEGATIVE
Nitrite, UA: NEGATIVE
Spec Grav, UA: 1.01 (ref 1.010–1.025)
pH, UA: 5 (ref 5.0–8.0)

## 2021-03-28 LAB — POCT WET PREP WITH KOH
Clue Cells Wet Prep HPF POC: NEGATIVE
KOH Prep POC: NEGATIVE
Trichomonas, UA: NEGATIVE
Yeast Wet Prep HPF POC: NEGATIVE

## 2021-03-28 MED ORDER — NITROFURANTOIN MONOHYD MACRO 100 MG PO CAPS: 100.0000 mg | ORAL_CAPSULE | Freq: Two times a day (BID) | ORAL | 0 refills | Status: AC

## 2021-03-28 MED ORDER — CLOBETASOL PROPIONATE 0.05 % EX OINT: TOPICAL_OINTMENT | CUTANEOUS | 0 refills | Status: DC

## 2021-03-28 NOTE — Progress Notes (Signed)
Chelsea Sell, MD   Chief Complaint  Patient presents with   Urinary Tract Infection    Painful urination, urgency, odor, pelvic pain x 1 week   Follow-up    Lichen sclerosis    HPI:      [name] is a 50 y.o. [phone] whose LMP was No LMP recorded. (Menstrual status: Perimenopausal)., presents today for UTI sx of dysuria, urgency, frequency, urine odor, and pelvic pain for past wk. No LBP, fevers, but doesn't feel well. No vag bleeding.  Drinking cranberry juice. No recent UTIs. Also with vaginal itching/irritation, no odor or d/c for the past wk. Hx of LS, but hasn't had to treat with clobetasol for over a yr.   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 positive 2003   BVD - LEEP   Diverticulitis 2019   Family history of ovarian cancer 05/2017   genetic testing letter sent   History of abnormal mammogram 03/2010; 2017/18   steriotactic bx - [facility]; again 2017/18   Hx of degenerative disc disease    Hypertension    Lichen sclerosus et atrophicus 2019   Tachycardia 2014   Torticollis 01/2015    Past Surgical History:  Procedure Laterality Date   BREAST BIOPSY Right 2011   Fibrocystic changes with ribbon marker   BREAST BIOPSY Right 2018   stereo with dumbell marker, complex sclerosing lesion, columnar cell change, PASH, sclerosing adenosis, no atypia or malignancy, no excision   CERVICAL BIOPSY  W/ LOOP ELECTRODE EXCISION  12/11/1996   CESAREAN SECTION  2004   CESAREAN SECTION  2002   CHOLECYSTECTOMY  2004   COLONOSCOPY  2007   COLONOSCOPY WITH PROPOFOL N/A 10/18/2017   Procedure: COLONOSCOPY WITH PROPOFOL;  Surgeon: Scot Jun, MD;  Location: [facility] ENDOSCOPY;  Service: Endoscopy;  Laterality: N/A;   TONSILLECTOMY     VULVA /PERINEUM BIOPSY  1999   WRIST SURGERY Left 12/2015   corrective osteotomy    Family History  Problem Relation Age of Onset   Breast  cancer Cousin 46       paternal second cousin   Heart disease Mother    Hypertension Mother    Heart disease Father    Hypertension Father    Colon polyps Father    Ovarian cancer Paternal Grandmother 58    Social History   Socioeconomic History   Marital status: Married    Spouse name: Not on file   Number of children: 2   Years of education: 16   Highest education level: Not on file  Occupational History   Occupation: JOURNALIST  Tobacco Use   Smoking status: Former    Types: Cigarettes    Quit date: 06/19/1995    Years since quitting: 25.7   Smokeless tobacco: Never  Vaping Use   Vaping Use: Never used  Substance and Sexual Activity   Alcohol use: Yes    Comment: occasionally   Drug use: No   Sexual activity: Not Currently    Partners: Male    Birth control/protection: None  Other Topics Concern   Not on file  Social History Narrative   Not on file   Social Determinants of Health   Financial Resource Strain: Not on file  Food Insecurity: Not on file  Transportation Needs: Not on file  Physical Activity: Not on file  Stress: Not on file  Social Connections:  Not on file  Intimate Partner Violence: Not on file    Outpatient Medications Prior to Visit  Medication Sig Dispense Refill   acetaminophen (TYLENOL) 325 MG tablet Take 2 tablets by mouth as needed.     Cyanocobalamin (VITAMIN B 12) 500 MCG TABS Take 2 tablets by mouth daily.     ELDERBERRY PO Take by mouth.     fluticasone (FLONASE) 50 MCG/ACT nasal spray Place 1 spray into both nostrils as needed for allergies or rhinitis.     loratadine (CLARITIN REDITABS) 10 MG dissolvable tablet Take by mouth.     Multiple Vitamins-Minerals (MULTIVITAMIN GUMMIES WOMENS PO) Take by mouth.     Probiotic Product (PROBIOTIC-10) CHEW Chew 2 tablets by mouth daily.     Norethindrone-Ethinyl Estradiol-Fe Biphas (LO LOESTRIN FE) 1 MG-10 MCG / 10 MCG tablet Take 1 tablet by mouth daily. 84 tablet 3   No  facility-administered medications prior to visit.    ROS:  Review of Systems  Constitutional:  Negative for fever.  Gastrointestinal:  Negative for blood in stool, constipation, diarrhea, nausea and vomiting.  Genitourinary:  Positive for dysuria, frequency, pelvic pain and urgency. Negative for dyspareunia, flank pain, hematuria, vaginal bleeding, vaginal discharge and vaginal pain.  Musculoskeletal:  Negative for back pain.  Skin:  Negative for rash.  BREAST: No symptoms   OBJECTIVE:   Vitals:  BP 120/70   Ht 5\' 5"  (1.651 m)   Wt 172 lb (78 kg)   BMI 28.62 kg/m   Physical Exam Vitals reviewed.  Constitutional:      Appearance: She is well-developed. She is not ill-appearing or toxic-appearing.  Pulmonary:     Effort: Pulmonary effort is normal.  Abdominal:     Tenderness: There is no right CVA tenderness or left CVA tenderness.  Genitourinary:    Pubic Area: No rash.      Labia:        Right: Rash present. No tenderness or lesion.        Left: Rash present. No tenderness or lesion.      Vagina: Normal. No vaginal discharge, erythema or tenderness.     Cervix: Normal.     Uterus: Normal. Not enlarged and not tender.      Adnexa: Right adnexa normal and left adnexa normal.       Right: No mass or tenderness.         Left: No mass or tenderness.         Comments: BILAT LABIA MINORA WITH WHITE TISSUE, SMALL FISSURES; SAME IN PERINEAL AREA Musculoskeletal:        General: Normal range of motion.     Cervical back: Normal range of motion.  Skin:    General: Skin is warm and dry.  Neurological:     General: No focal deficit present.     Mental Status: She is alert and oriented to person, place, and time.     Cranial Nerves: No cranial nerve deficit.  Psychiatric:        Mood and Affect: Mood normal.        Behavior: Behavior normal.        Thought Content: Thought content normal.        Judgment: Judgment normal.    Results: Results for orders placed or  performed in visit on 03/28/21 (from the past 24 hour(s))  POCT Wet Prep with KOH     Status: Normal   Collection Time: 03/28/21 10:10 AM  Result Value Ref Range  Trichomonas, UA Negative    Clue Cells Wet Prep HPF POC neg    Epithelial Wet Prep HPF POC     Yeast Wet Prep HPF POC neg    Bacteria Wet Prep HPF POC     RBC Wet Prep HPF POC     WBC Wet Prep HPF POC     KOH Prep POC Negative Negative  POCT Urinalysis Dipstick     Status: Abnormal   Collection Time: 03/28/21 10:10 AM  Result Value Ref Range   Color, UA pale    Clarity, UA clear    Glucose, UA Negative Negative   Bilirubin, UA neg    Ketones, UA neg    Spec Grav, UA 1.010 1.010 - 1.025   Blood, UA mod    pH, UA 5.0 5.0 - 8.0   Protein, UA     Urobilinogen, UA     Nitrite, UA neg    Leukocytes, UA Moderate (2+) (A) Negative   Appearance     Odor       Assessment/Plan: UTI symptoms - Plan: nitrofurantoin, macrocrystal-monohydrate, (MACROBID) 100 MG capsule, POCT Urinalysis Dipstick, Urine Culture; pos sx and UA. Rx macrobid, check C&S. Will f/u prn.   Lichen sclerosus et atrophicus - Plan: clobetasol ointment (TEMOVATE) 0.05 %; sx flare. Rx RF clobetasol. Will f/u at 12/22 annual  Vaginal itching - Plan: POCT Wet Prep with KOH; neg wet prep. Most likely due to LS and not yeast. F/u prn.    Meds ordered this encounter  Medications   nitrofurantoin, macrocrystal-monohydrate, (MACROBID) 100 MG capsule    Sig: Take 1 capsule (100 mg total) by mouth 2 (two) times daily for 5 days.    Dispense:  10 capsule    Refill:  0    Order Specific Question:   Supervising Provider    Answer:   Nadara Mustard [[id number]]   clobetasol ointment (TEMOVATE) 0.05 %    Sig: Apply to affected area every night for 4 weeks, then every other day for 4 weeks and then twice a week for 4 weeks or until resolution.    Dispense:  30 g    Refill:  0    Order Specific Question:   Supervising Provider    Answer:   Nadara Mustard [[id number]]       Return if symptoms worsen or fail to improve.  Danett Palazzo B. Aryianna Earwood, PA-C 03/28/2021 10:12 AM

## 2021-03-31 LAB — URINE CULTURE

## 2021-03-31 NOTE — Progress Notes (Signed)
Pls let pt know C&S showed UTI. The bacteria sometimes responds to the abx I gave her, sometimes not. If she's still having sx, I'll send in different abx for her. Thx.

## 2021-06-04 NOTE — Progress Notes (Addendum)
PCP: Mick Sell, MD   Chief Complaint  Patient presents with   Gynecologic Exam    Bump on LB that noticed last week    HPI:      Chelsea Charles is a 50 y.o. [id number] whose LMP was No LMP recorded. (Menstrual status: Perimenopausal)., presents today for her annual examination.  Her menses are occas 7 day light to mod periods vs occas dark brown or red vag d/c. No dysmen, no AUB. Had infrequent spotting with Lo Lo OCPs last yr, so OCPs stopped. Has night sweats.   Sex activity: not sexually active due to husband health issue.  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   Last Pap: 05/27/19  Results were: no abnormalities /neg HPV DNA. Repeat due 12/23 Hx of STDs: HPV on pap;  S/p LEEP 1998  Last mammogram: 07/14/20  Results were: normal--routine follow-up in 12 months S/p stereotactic biopsy on her right breast for suspicious calcifications;  Returned as complex sclerosing lesion/PASH There is a FH of breast cancer in her pat 2nd cousin. There is a FH of ovarian cancer in her PGM, genetic testing not done in past; pt interested this yr. The patient does self-breast exams. Noticed lesion on LT breast last wk; sx improved this wk.   Colonoscopy: 2019 with polyps with [facility];  Repeat due after 3 years.   06/20/21 Pt called back to say GI said colonoscopy not due till 2024.   Tobacco use: The patient denies current or previous tobacco use. Alcohol use: social drinker  No drug use Exercise: mod active  She does get adequate calcium and Vitamin D in her diet.  Labs with PCP.  Has had UTI sx of frequency and burning a few times recently, sx improved with AZO.    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 positive 2003   BVD - LEEP   Diverticulitis 2019   Family history of ovarian cancer 05/2017   genetic testing letter sent   History of abnormal mammogram  03/2010; 2017/18   steriotactic bx - [facility]; again 2017/18   Hx of degenerative disc disease    Hypertension    Lichen sclerosus et atrophicus 2019   Tachycardia 2014   Torticollis 01/2015    Past Surgical History:  Procedure Laterality Date   BREAST BIOPSY Right 2011   Fibrocystic changes with ribbon marker   BREAST BIOPSY Right 2018   stereo with dumbell marker, complex sclerosing lesion, columnar cell change, PASH, sclerosing adenosis, no atypia or malignancy, no excision   CERVICAL BIOPSY  W/ LOOP ELECTRODE EXCISION  12/11/1996   CESAREAN SECTION  2004   CESAREAN SECTION  2002   CHOLECYSTECTOMY  2004   COLONOSCOPY  2007   COLONOSCOPY WITH PROPOFOL N/A 10/18/2017   Procedure: COLONOSCOPY WITH PROPOFOL;  Surgeon: Scot Jun, MD;  Location: [facility] ENDOSCOPY;  Service: Endoscopy;  Laterality: N/A;   TONSILLECTOMY     VULVA /PERINEUM BIOPSY  1999   WRIST SURGERY Left 12/2015   corrective osteotomy    Family History  Problem Relation Age of Onset   Breast cancer Cousin 37       paternal second cousin   Heart disease Mother    Hypertension Mother    Heart disease Father    Hypertension Father    Colon polyps Father    Ovarian  cancer Paternal Grandmother 52    Social History   Socioeconomic History   Marital status: Married    Spouse name: Not on file   Number of children: 2   Years of education: 16   Highest education level: Not on file  Occupational History   Occupation: JOURNALIST  Tobacco Use   Smoking status: Former    Types: Cigarettes    Quit date: 06/19/1995    Years since quitting: 25.9   Smokeless tobacco: Never  Vaping Use   Vaping Use: Never used  Substance and Sexual Activity   Alcohol use: Yes    Comment: occasionally   Drug use: No   Sexual activity: Not Currently    Partners: Male    Birth control/protection: None  Other Topics Concern   Not on file  Social History Narrative   Not on file   Social Determinants of Health   Financial  Resource Strain: Not on file  Food Insecurity: Not on file  Transportation Needs: Not on file  Physical Activity: Not on file  Stress: Not on file  Social Connections: Not on file  Intimate Partner Violence: Not on file     Current Outpatient Medications:    acetaminophen (TYLENOL) 325 MG tablet, Take 2 tablets by mouth as needed., Disp: , Rfl:    Cholecalciferol (VITAMIN D3 PO), , Disp: , Rfl:    Cyanocobalamin (VITAMIN B 12) 500 MCG TABS, Take 2 tablets by mouth daily., Disp: , Rfl:    ELDERBERRY PO, Take by mouth., Disp: , Rfl:    fluticasone (FLONASE) 50 MCG/ACT nasal spray, Place 1 spray into both nostrils as needed for allergies or rhinitis., Disp: , Rfl:    loratadine (CLARITIN REDITABS) 10 MG dissolvable tablet, Take by mouth., Disp: , Rfl:    Multiple Vitamins-Minerals (MULTIVITAMIN GUMMIES WOMENS PO), Take by mouth., Disp: , Rfl:    Probiotic Product (PROBIOTIC-10) CHEW, Chew 2 tablets by mouth daily., Disp: , Rfl:    carbidopa-levodopa (SINEMET IR) 25-100 MG tablet, Take 1 tablet by mouth 3 (three) times daily. (Patient not taking: Reported on 06/05/2021), Disp: , Rfl:    clobetasol ointment (TEMOVATE) 0.05 %, Apply to affected area every 1-2 wks for sx control, Disp: 30 g, Rfl: 0     ROS:  Review of Systems  Constitutional:  Negative for fatigue, fever and unexpected weight change.  Respiratory:  Negative for cough, shortness of breath and wheezing.   Cardiovascular:  Negative for chest pain, palpitations and leg swelling.  Gastrointestinal:  Negative for blood in stool, constipation, diarrhea, nausea and vomiting.  Endocrine: Negative for cold intolerance, heat intolerance and polyuria.  Genitourinary:  Positive for dysuria, frequency and vaginal bleeding. Negative for dyspareunia, flank pain, genital sores, hematuria, menstrual problem, pelvic pain, urgency, vaginal discharge and vaginal pain.  Musculoskeletal:  Negative for back pain, joint swelling and myalgias.   Skin:  Negative for rash.  Neurological:  Positive for headaches. Negative for dizziness, syncope, light-headedness and numbness.  Hematological:  Negative for adenopathy.  Psychiatric/Behavioral:  Negative for agitation, confusion, sleep disturbance and suicidal ideas. The patient is not nervous/anxious.   BREAST: No symptoms    Objective: BP 110/70    Ht $R'5\' 4"'SF$  (1.626 m)    Wt 173 lb (78.5 kg)    BMI 29.70 kg/m    Physical Exam Constitutional:      Appearance: She is well-developed.  Genitourinary:     Vulva normal.     Right Labia: No rash, tenderness or  lesions.    Left Labia: No tenderness, lesions or rash.    No vaginal discharge, erythema or tenderness.      Right Adnexa: not tender and no mass present.    Left Adnexa: not tender and no mass present.    No cervical friability or polyp.     Uterus is not enlarged or tender.  Breasts:    Right: No mass, nipple discharge, skin change or tenderness.     Left: No mass, nipple discharge, skin change or tenderness.  Neck:     Thyroid: No thyromegaly.  Cardiovascular:     Rate and Rhythm: Normal rate and regular rhythm.     Heart sounds: Normal heart sounds. No murmur heard. Pulmonary:     Effort: Pulmonary effort is normal.     Breath sounds: Normal breath sounds.  Chest:    Abdominal:     Palpations: Abdomen is soft.     Tenderness: There is no abdominal tenderness. There is no guarding or rebound.  Musculoskeletal:        General: Normal range of motion.     Cervical back: Normal range of motion.  Lymphadenopathy:     Cervical: No cervical adenopathy.  Neurological:     General: No focal deficit present.     Mental Status: She is alert and oriented to person, place, and time.     Cranial Nerves: No cranial nerve deficit.  Skin:    General: Skin is warm and dry.  Psychiatric:        Mood and Affect: Mood normal.        Behavior: Behavior normal.        Thought Content: Thought content normal.         Judgment: Judgment normal.  Vitals reviewed.    Assessment/Plan: Encounter for annual routine gynecological examination  Encounter for screening mammogram for malignant neoplasm of breast - Plan: MM 3D SCREEN BREAST BILATERAL; pt to [facility]. Resolved "pimple" LT breast, reassurance. F/u prn.   Family history of ovarian cancer - Plan: [facility] ([place]); MyRisk testing discussed and done today. Will call with results.   Perimenopause--f/u prn AUB.  Screening for colon cancer--pt to call [facility] for appt; will send ref prn.  Lichen sclerosus et atrophicus - Plan: clobetasol ointment (TEMOVATE) 0.05 %; sx controlled. Rx RF. F/u prn.   Meds ordered this encounter  Medications   clobetasol ointment (TEMOVATE) 0.05 %    Sig: Apply to affected area every 1-2 wks for sx control    Dispense:  30 g    Refill:  0    Order Specific Question:   Supervising Provider    Answer:   Gae Dry [[id number]]           GYN counsel breast self exam, mammography screening, menopause, adequate intake of calcium and vitamin D, diet and exercise    F/U  Return in about 1 year (around 06/05/2022).  Chelsea Creason B. Calob Baskette, PA-C 06/05/2021 10:00 AM

## 2021-06-05 ENCOUNTER — Ambulatory Visit (INDEPENDENT_AMBULATORY_CARE_PROVIDER_SITE_OTHER): Payer: Managed Care, Other (non HMO) | Admitting: Obstetrics and Gynecology

## 2021-06-05 ENCOUNTER — Encounter: Payer: Self-pay | Admitting: Obstetrics and Gynecology

## 2021-06-05 ENCOUNTER — Other Ambulatory Visit: Payer: Self-pay

## 2021-06-05 VITALS — BP 110/70 | Ht 64.0 in | Wt 173.0 lb

## 2021-06-05 DIAGNOSIS — Z1231 Encounter for screening mammogram for malignant neoplasm of breast: Secondary | ICD-10-CM | POA: Diagnosis not present

## 2021-06-05 DIAGNOSIS — Z8041 Family history of malignant neoplasm of ovary: Secondary | ICD-10-CM

## 2021-06-05 DIAGNOSIS — N951 Menopausal and female climacteric states: Secondary | ICD-10-CM | POA: Insufficient documentation

## 2021-06-05 DIAGNOSIS — L900 Lichen sclerosus et atrophicus: Secondary | ICD-10-CM

## 2021-06-05 DIAGNOSIS — Z01419 Encounter for gynecological examination (general) (routine) without abnormal findings: Secondary | ICD-10-CM

## 2021-06-05 DIAGNOSIS — Z1211 Encounter for screening for malignant neoplasm of colon: Secondary | ICD-10-CM | POA: Diagnosis not present

## 2021-06-05 MED ORDER — CLOBETASOL PROPIONATE 0.05 % EX OINT: TOPICAL_OINTMENT | CUTANEOUS | 0 refills | Status: DC

## 2021-06-05 NOTE — Patient Instructions (Signed)
I value your feedback and you entrusting us with your care. If you get a [facility] patient survey, I would appreciate you taking the time to let us know about your experience today. Thank you! ? ? ?

## 2021-06-14 ENCOUNTER — Other Ambulatory Visit: Payer: Self-pay | Admitting: Neurology

## 2021-06-14 DIAGNOSIS — M4802 Spinal stenosis, cervical region: Secondary | ICD-10-CM

## 2021-06-18 DIAGNOSIS — Z1371 Encounter for nonprocreative screening for genetic disease carrier status: Secondary | ICD-10-CM

## 2021-06-18 DIAGNOSIS — Z9189 Other specified personal risk factors, not elsewhere classified: Secondary | ICD-10-CM

## 2021-06-18 HISTORY — DX: Encounter for nonprocreative screening for genetic disease carrier status: Z13.71

## 2021-06-18 HISTORY — DX: Other specified personal risk factors, not elsewhere classified: Z91.89

## 2021-06-20 ENCOUNTER — Telehealth: Payer: Self-pay

## 2021-06-20 NOTE — Telephone Encounter (Signed)
Chart updated

## 2021-06-20 NOTE — Telephone Encounter (Signed)
Pt calling; [facility] adv her to get a colonoscopy; she call her GI and was told per Dr. Earnest Conroy notes she isn't due until 2024.  [phone]

## 2021-06-26 ENCOUNTER — Ambulatory Visit
Admission: RE | Admit: 2021-06-26 | Discharge: 2021-06-26 | Disposition: A | Discharge status: Home / Self Care | Payer: Self-pay | Source: Ambulatory Visit | Attending: Neurology | Admitting: Neurology

## 2021-06-26 DIAGNOSIS — M4802 Spinal stenosis, cervical region: Secondary | ICD-10-CM

## 2021-07-13 ENCOUNTER — Encounter: Payer: Self-pay | Admitting: Obstetrics and Gynecology

## 2021-07-18 ENCOUNTER — Ambulatory Visit
Admission: RE | Admit: 2021-07-18 | Discharge: 2021-07-18 | Disposition: A | Discharge status: Home / Self Care | Payer: Managed Care, Other (non HMO) | Source: Ambulatory Visit | Attending: Obstetrics and Gynecology | Admitting: Obstetrics and Gynecology

## 2021-07-18 ENCOUNTER — Other Ambulatory Visit: Payer: Self-pay

## 2021-07-18 DIAGNOSIS — Z1231 Encounter for screening mammogram for malignant neoplasm of breast: Secondary | ICD-10-CM | POA: Insufficient documentation

## 2021-07-25 ENCOUNTER — Telehealth: Payer: Self-pay | Admitting: Obstetrics and Gynecology

## 2021-07-25 NOTE — Telephone Encounter (Signed)
Pt returned your call.  

## 2021-07-27 NOTE — Telephone Encounter (Signed)
Pt aware of neg MyRisk results, except MSH6 VUS. IBIS=12.1%/riskscore=22%. Pt has already spoken to GC at [facility] per her ins requirements. °Pt aware of recommendations of monthly SBE, yearly CBE and mammos and scr breast MRI. Current on mammo 1/23, will call if desires scr breast MRI.  ° ° °

## 2021-11-14 DIAGNOSIS — G43709 Chronic migraine without aura, not intractable, without status migrainosus: Secondary | ICD-10-CM | POA: Diagnosis not present

## 2021-11-24 DIAGNOSIS — G243 Spasmodic torticollis: Secondary | ICD-10-CM | POA: Diagnosis not present

## 2021-12-21 DIAGNOSIS — T63461A Toxic effect of venom of wasps, accidental (unintentional), initial encounter: Secondary | ICD-10-CM | POA: Diagnosis not present

## 2022-01-30 DIAGNOSIS — E559 Vitamin D deficiency, unspecified: Secondary | ICD-10-CM | POA: Diagnosis not present

## 2022-01-30 DIAGNOSIS — G5603 Carpal tunnel syndrome, bilateral upper limbs: Secondary | ICD-10-CM | POA: Diagnosis not present

## 2022-01-30 DIAGNOSIS — G243 Spasmodic torticollis: Secondary | ICD-10-CM | POA: Diagnosis not present

## 2022-01-30 DIAGNOSIS — M4802 Spinal stenosis, cervical region: Secondary | ICD-10-CM | POA: Diagnosis not present

## 2022-02-12 DIAGNOSIS — G43709 Chronic migraine without aura, not intractable, without status migrainosus: Secondary | ICD-10-CM | POA: Diagnosis not present

## 2022-03-12 DIAGNOSIS — G243 Spasmodic torticollis: Secondary | ICD-10-CM | POA: Diagnosis not present

## 2022-03-16 DIAGNOSIS — Z87898 Personal history of other specified conditions: Secondary | ICD-10-CM | POA: Diagnosis not present

## 2022-03-16 DIAGNOSIS — Z0000 Encounter for general adult medical examination without abnormal findings: Secondary | ICD-10-CM | POA: Diagnosis not present

## 2022-03-16 DIAGNOSIS — M25552 Pain in left hip: Secondary | ICD-10-CM | POA: Diagnosis not present

## 2022-03-16 DIAGNOSIS — E538 Deficiency of other specified B group vitamins: Secondary | ICD-10-CM | POA: Diagnosis not present

## 2022-03-16 DIAGNOSIS — G43809 Other migraine, not intractable, without status migrainosus: Secondary | ICD-10-CM | POA: Diagnosis not present

## 2022-04-04 ENCOUNTER — Other Ambulatory Visit: Payer: Self-pay | Admitting: Obstetrics and Gynecology

## 2022-04-04 DIAGNOSIS — Z1231 Encounter for screening mammogram for malignant neoplasm of breast: Secondary | ICD-10-CM

## 2022-05-18 DIAGNOSIS — R6889 Other general symptoms and signs: Secondary | ICD-10-CM | POA: Diagnosis not present

## 2022-06-06 NOTE — Progress Notes (Unsigned)
PCP: Leonel Ramsay, MD   No chief complaint on file.   HPI:      [name] is a 51 y.o. G2P2002 whose LMP was No LMP recorded. (Menstrual status: Perimenopausal)., presents today for her annual examination.  Her menses are occas 7 day light to mod periods vs occas dark brown or red vag d/c. No dysmen, no AUB. Had infrequent spotting with Lo Lo OCPs last yr, so OCPs stopped. Has night sweats.   Sex activity: not sexually active due to husband health issue.  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   Last Pap: 05/27/19  Results were: no abnormalities /neg HPV DNA. Repeat due 12/23 Hx of STDs: HPV on pap;  S/p LEEP 1998  Last mammogram: 07/18/21  Results were: normal--routine follow-up in 12 months. Has appt 2/24. S/p stereotactic biopsy on her right breast for suspicious calcifications 2/18;  Returned as complex sclerosing lesion/PASH There is a FH of breast cancer in her pat 2nd cousin. There is a FH of ovarian cancer in her PGM. The patient does self-breast exams. Pt is MyRisk neg except *** VUS 2022; IBIS=12.1%/riskscore=22.0%   Colonoscopy: 2019 with polyps with [facility];  Repeat due after 3 years.  06/20/21 Pt called back to say GI said colonoscopy not due till 2024.   Tobacco use: The patient denies current or previous tobacco use. Alcohol use: social drinker  No drug use Exercise: mod active  She does get adequate calcium and Vitamin D in her diet.  Labs with PCP.  Has had UTI sx of frequency and burning a few times recently, sx improved with AZO.    Past Medical History:  Diagnosis Date   Anemia    BRCA negative 06/2021   MyRisk neg except MSH6 VUS   Cervical dystonia    Cervical high risk HPV (human papillomavirus) test positive 2003   BVD - LEEP   Diverticulitis 2019   Family history of ovarian cancer 05/2017   genetic testing letter sent   History of abnormal mammogram 03/2010; 2017/18   steriotactic bx - [facility];  again 2017/18   Hx of degenerative disc disease    Hypertension    Increased risk of breast cancer 06/2021   GYFV=49.4%/WHQPRFFMB=84.6%   Lichen sclerosus et atrophicus 2019   Tachycardia 2014   Torticollis 01/2015    Past Surgical History:  Procedure Laterality Date   BREAST BIOPSY Right 2011   Fibrocystic changes with ribbon marker   BREAST BIOPSY Right 2018   stereo with dumbell marker, complex sclerosing lesion, columnar cell change, PASH, sclerosing adenosis, no atypia or malignancy, no excision   CERVICAL BIOPSY  W/ LOOP ELECTRODE EXCISION  12/11/1996   CESAREAN SECTION  2004   CESAREAN SECTION  2002   CHOLECYSTECTOMY  2004   COLONOSCOPY  2007   COLONOSCOPY WITH PROPOFOL N/A 10/18/2017   Procedure: COLONOSCOPY WITH PROPOFOL;  Surgeon: Manya Silvas, MD;  [facility] ENDOSCOPY;  Service: Endoscopy;  Laterality: N/A;   TONSILLECTOMY     VULVA /PERINEUM BIOPSY  1999   WRIST SURGERY Left 12/2015   corrective osteotomy    Family History  Problem Relation Age of Onset   Breast cancer Cousin 36       paternal second cousin   Heart disease Mother    Hypertension Mother    Heart disease Father    Hypertension Father    Colon polyps Father    Ovarian cancer Paternal  Grandmother 61    Social History   Socioeconomic History   Marital status: Married    Spouse name: Not on file   Number of children: 2   Years of education: 16   Highest education level: Not on file  Occupational History   Occupation: JOURNALIST  Tobacco Use   Smoking status: Former    Types: Cigarettes    Quit date: 06/19/1995    Years since quitting: 26.9   Smokeless tobacco: Never  Vaping Use   Vaping Use: Never used  Substance and Sexual Activity   Alcohol use: Yes    Comment: occasionally   Drug use: No   Sexual activity: Not Currently    Partners: Male    Birth control/protection: None  Other Topics Concern   Not on file  Social History Narrative   Not on file   Social  Determinants of Health   Financial Resource Strain: Not on file  Food Insecurity: Not on file  Transportation Needs: Not on file  Physical Activity: Insufficiently Active (05/17/2017)   Exercise Vital Sign    Days of Exercise per Week: 3 days    Minutes of Exercise per Session: 30 min  Stress: Stress Concern Present (05/17/2017)   [place]    Feeling of Stress : Rather much  Social Connections: Socially Integrated (05/17/2017)   Social Connection and Isolation Panel [NHANES]    Frequency of Communication with Friends and Family: More than three times a week    Frequency of Social Gatherings with Friends and Family: More than three times a week    Attends Religious Services: More than 4 times per year    Active Member of [employer] or Organizations: Yes    Attends Archivist Meetings: More than 4 times per year    Marital Status: Married  Human resources officer Violence: Not At Risk (05/17/2017)   Humiliation, Afraid, Rape, and Kick questionnaire    Fear of Current or Ex-Partner: No    Emotionally Abused: No    Physically Abused: No    Sexually Abused: No     Current Outpatient Medications:    acetaminophen (TYLENOL) 325 MG tablet, Take 2 tablets by mouth as needed., Disp: , Rfl:    carbidopa-levodopa (SINEMET IR) 25-100 MG tablet, Take 1 tablet by mouth 3 (three) times daily. (Patient not taking: Reported on 06/05/2021), Disp: , Rfl:    Cholecalciferol (VITAMIN D3 PO), , Disp: , Rfl:    clobetasol ointment (TEMOVATE) 0.05 %, Apply to affected area every 1-2 wks for sx control, Disp: 30 g, Rfl: 0   Cyanocobalamin (VITAMIN B 12) 500 MCG TABS, Take 2 tablets by mouth daily., Disp: , Rfl:    ELDERBERRY PO, Take by mouth., Disp: , Rfl:    fluticasone (FLONASE) 50 MCG/ACT nasal spray, Place 1 spray into both nostrils as needed for allergies or rhinitis., Disp: , Rfl:    loratadine (CLARITIN REDITABS) 10 MG dissolvable  tablet, Take by mouth., Disp: , Rfl:    Multiple Vitamins-Minerals (MULTIVITAMIN GUMMIES WOMENS PO), Take by mouth., Disp: , Rfl:    Probiotic Product (PROBIOTIC-10) CHEW, Chew 2 tablets by mouth daily., Disp: , Rfl:      ROS:  Review of Systems  Constitutional:  Negative for fatigue, fever and unexpected weight change.  Respiratory:  Negative for cough, shortness of breath and wheezing.   Cardiovascular:  Negative for chest pain, palpitations and leg swelling.  Gastrointestinal:  Negative for blood in  stool, constipation, diarrhea, nausea and vomiting.  Endocrine: Negative for cold intolerance, heat intolerance and polyuria.  Genitourinary:  Positive for dysuria, frequency and vaginal bleeding. Negative for dyspareunia, flank pain, genital sores, hematuria, menstrual problem, pelvic pain, urgency, vaginal discharge and vaginal pain.  Musculoskeletal:  Negative for back pain, joint swelling and myalgias.  Skin:  Negative for rash.  Neurological:  Positive for headaches. Negative for dizziness, syncope, light-headedness and numbness.  Hematological:  Negative for adenopathy.  Psychiatric/Behavioral:  Negative for agitation, confusion, sleep disturbance and suicidal ideas. The patient is not nervous/anxious.    BREAST: No symptoms    Objective: There were no vitals taken for this visit.   Physical Exam Constitutional:      Appearance: She is well-developed.  Genitourinary:     Vulva normal.     Right Labia: No rash, tenderness or lesions.    Left Labia: No tenderness, lesions or rash.    No vaginal discharge, erythema or tenderness.      Right Adnexa: not tender and no mass present.    Left Adnexa: not tender and no mass present.    No cervical friability or polyp.     Uterus is not enlarged or tender.  Breasts:    Right: No mass, nipple discharge, skin change or tenderness.     Left: No mass, nipple discharge, skin change or tenderness.  Neck:     Thyroid: No  thyromegaly.  Cardiovascular:     Rate and Rhythm: Normal rate and regular rhythm.     Heart sounds: Normal heart sounds. No murmur heard. Pulmonary:     Effort: Pulmonary effort is normal.     Breath sounds: Normal breath sounds.  Chest:    Abdominal:     Palpations: Abdomen is soft.     Tenderness: There is no abdominal tenderness. There is no guarding or rebound.  Musculoskeletal:        General: Normal range of motion.     Cervical back: Normal range of motion.  Lymphadenopathy:     Cervical: No cervical adenopathy.  Neurological:     General: No focal deficit present.     Mental Status: She is alert and oriented to person, place, and time.     Cranial Nerves: No cranial nerve deficit.  Skin:    General: Skin is warm and dry.  Psychiatric:        Mood and Affect: Mood normal.        Behavior: Behavior normal.        Thought Content: Thought content normal.        Judgment: Judgment normal.  Vitals reviewed.     Assessment/Plan: Encounter for annual routine gynecological examination  Encounter for screening mammogram for malignant neoplasm of breast - Plan: MM 3D SCREEN BREAST BILATERAL; pt to [facility]. Resolved "pimple" LT breast, reassurance. F/u prn.   Family history of ovarian cancer - Plan: Integrated BRACAnalysis ([place]); MyRisk testing discussed and done today. Will call with results.   Perimenopause--f/u prn AUB.  Screening for colon cancer--pt to call [facility] for appt; will send ref prn.  Lichen sclerosus et atrophicus - Plan: clobetasol ointment (TEMOVATE) 0.05 %; sx controlled. Rx RF. F/u prn.   No orders of the defined types were placed in this encounter.          GYN counsel breast self exam, mammography screening, menopause, adequate intake of calcium and vitamin D, diet and exercise    F/U  No follow-ups  on file.  Alicia B. Copland, PA-C 06/06/2022 5:19 PM

## 2022-06-07 ENCOUNTER — Ambulatory Visit (INDEPENDENT_AMBULATORY_CARE_PROVIDER_SITE_OTHER): Payer: BC Managed Care – PPO | Admitting: Obstetrics and Gynecology

## 2022-06-07 ENCOUNTER — Encounter: Payer: Self-pay | Admitting: Obstetrics and Gynecology

## 2022-06-07 ENCOUNTER — Other Ambulatory Visit (HOSPITAL_COMMUNITY)
Admission: RE | Admit: 2022-06-07 | Discharge: 2022-06-07 | Disposition: A | Discharge status: Home / Self Care | Payer: BC Managed Care – PPO | Source: Ambulatory Visit | Attending: Obstetrics and Gynecology | Admitting: Obstetrics and Gynecology

## 2022-06-07 VITALS — BP 108/64 | Ht 64.0 in | Wt 179.0 lb

## 2022-06-07 DIAGNOSIS — Z01411 Encounter for gynecological examination (general) (routine) with abnormal findings: Secondary | ICD-10-CM

## 2022-06-07 DIAGNOSIS — Z1151 Encounter for screening for human papillomavirus (HPV): Secondary | ICD-10-CM

## 2022-06-07 DIAGNOSIS — Z1231 Encounter for screening mammogram for malignant neoplasm of breast: Secondary | ICD-10-CM

## 2022-06-07 DIAGNOSIS — L900 Lichen sclerosus et atrophicus: Secondary | ICD-10-CM | POA: Diagnosis not present

## 2022-06-07 DIAGNOSIS — Z01419 Encounter for gynecological examination (general) (routine) without abnormal findings: Secondary | ICD-10-CM

## 2022-06-07 DIAGNOSIS — Z1211 Encounter for screening for malignant neoplasm of colon: Secondary | ICD-10-CM

## 2022-06-07 DIAGNOSIS — Z8041 Family history of malignant neoplasm of ovary: Secondary | ICD-10-CM

## 2022-06-07 DIAGNOSIS — Z124 Encounter for screening for malignant neoplasm of cervix: Secondary | ICD-10-CM

## 2022-06-07 MED ORDER — CLOBETASOL PROPIONATE 0.05 % EX OINT: TOPICAL_OINTMENT | CUTANEOUS | 0 refills | Status: DC

## 2022-06-07 NOTE — Patient Instructions (Signed)
I value your feedback and you entrusting us with your care. If you get a [facility] patient survey, I would appreciate you taking the time to let us know about your experience today. Thank you!  [facility]: [phone]      

## 2022-06-12 LAB — CYTOLOGY - PAP
Adequacy: ABSENT
Comment: NEGATIVE
Diagnosis: NEGATIVE
High risk HPV: NEGATIVE

## 2022-07-19 ENCOUNTER — Ambulatory Visit
Admission: RE | Admit: 2022-07-19 | Discharge: 2022-07-19 | Disposition: A | Discharge status: Home / Self Care | Payer: BC Managed Care – PPO | Source: Ambulatory Visit | Attending: Obstetrics and Gynecology | Admitting: Obstetrics and Gynecology

## 2022-07-19 DIAGNOSIS — Z1231 Encounter for screening mammogram for malignant neoplasm of breast: Secondary | ICD-10-CM | POA: Diagnosis not present

## 2022-07-31 DIAGNOSIS — M4802 Spinal stenosis, cervical region: Secondary | ICD-10-CM | POA: Diagnosis not present

## 2022-07-31 DIAGNOSIS — G5603 Carpal tunnel syndrome, bilateral upper limbs: Secondary | ICD-10-CM | POA: Diagnosis not present

## 2022-07-31 DIAGNOSIS — G243 Spasmodic torticollis: Secondary | ICD-10-CM | POA: Diagnosis not present

## 2022-07-31 DIAGNOSIS — G252 Other specified forms of tremor: Secondary | ICD-10-CM | POA: Diagnosis not present

## 2022-08-15 DIAGNOSIS — G243 Spasmodic torticollis: Secondary | ICD-10-CM | POA: Diagnosis not present

## 2022-09-03 DIAGNOSIS — Z03818 Encounter for observation for suspected exposure to other biological agents ruled out: Secondary | ICD-10-CM | POA: Diagnosis not present

## 2022-09-03 DIAGNOSIS — R051 Acute cough: Secondary | ICD-10-CM | POA: Diagnosis not present

## 2022-09-03 DIAGNOSIS — R309 Painful micturition, unspecified: Secondary | ICD-10-CM | POA: Diagnosis not present

## 2022-09-03 DIAGNOSIS — R0981 Nasal congestion: Secondary | ICD-10-CM | POA: Diagnosis not present

## 2022-11-26 DIAGNOSIS — G243 Spasmodic torticollis: Secondary | ICD-10-CM | POA: Diagnosis not present

## 2023-02-13 ENCOUNTER — Ambulatory Visit: Payer: BC Managed Care – PPO

## 2023-02-13 DIAGNOSIS — K621 Rectal polyp: Secondary | ICD-10-CM | POA: Diagnosis not present

## 2023-02-13 DIAGNOSIS — Z09 Encounter for follow-up examination after completed treatment for conditions other than malignant neoplasm: Secondary | ICD-10-CM | POA: Diagnosis present

## 2023-02-13 DIAGNOSIS — K5730 Diverticulosis of large intestine without perforation or abscess without bleeding: Secondary | ICD-10-CM | POA: Diagnosis not present

## 2023-02-13 DIAGNOSIS — Z86010 Personal history of colonic polyps: Secondary | ICD-10-CM | POA: Diagnosis not present

## 2023-02-13 DIAGNOSIS — K640 First degree hemorrhoids: Secondary | ICD-10-CM | POA: Diagnosis not present

## 2023-06-07 NOTE — Progress Notes (Unsigned)
PCP: Mick Sell, MD   No chief complaint on file.   HPI:      [name] is a 52 y.o. G2P2002 whose LMP was No LMP recorded. (Menstrual status: Perimenopausal)., presents today for her annual examination.  Her menses are absent for a year, no PMB.  Has night sweats but tolerable/improved this yr.    Sex activity: not sexually active due to husband health issue. No vag sx.  Hx of LS, treated with clobetasol every other wk with sx control usually but no recent tx.   Last Pap: 06/07/22  Results were: no abnormalities /neg HPV DNA. Repeat due 12/28 Hx of STDs: HPV on pap;  S/p LEEP 1998  Last mammogram: 07/19/22  Results were: normal--routine follow-up in 12 months. S/p stereotactic biopsy on her right breast for suspicious calcifications 2/18;  Returned as complex sclerosing lesion/PASH There is a FH of breast cancer in her pat 2nd cousin. There is a FH of ovarian cancer in her PGM. The patient does self-breast exams. Pt is MyRisk neg except MSH6 VUS 2022; IBIS=12.1%/riskscore=22.0% (? Accurate since 2nd cousin and listed as 1st cousin on MyRisk test req)   Colonoscopy: 2019 with polyps with [facility];  Repeat due after 5 years.  06/20/21 Pt called back to say GI said colonoscopy not due till 2024.   Tobacco use: The patient denies current or previous tobacco use. Alcohol use: social drinker  No drug use Exercise: mod active  She does get adequate calcium and Vitamin D in her diet.  Labs with PCP.   Past Medical History:  Diagnosis Date   Anemia    BRCA negative 06/2021   MyRisk neg except MSH6 VUS   Cervical dystonia    Cervical high risk HPV (human papillomavirus) test positive 2003   BVD - LEEP   Diverticulitis 2019   Family history of ovarian cancer 05/2017   genetic testing letter sent   History of abnormal mammogram 03/2010; 2017/18   steriotactic bx - [facility]; again 2017/18   Hx of degenerative disc disease    Hypertension    Increased risk of breast  cancer 06/2021   IBIS=12.1%/riskscore=22.0%   Lichen sclerosus et atrophicus 2019   Tachycardia 2014   Torticollis 01/2015    Past Surgical History:  Procedure Laterality Date   BREAST BIOPSY Right 2011   Fibrocystic changes with ribbon marker   BREAST BIOPSY Right 2018   stereo with dumbell marker, complex sclerosing lesion, columnar cell change, PASH, sclerosing adenosis, no atypia or malignancy, no excision   CERVICAL BIOPSY  W/ LOOP ELECTRODE EXCISION  12/11/1996   CESAREAN SECTION  2004   CESAREAN SECTION  2002   CHOLECYSTECTOMY  2004   COLONOSCOPY  2007   COLONOSCOPY WITH PROPOFOL N/A 10/18/2017   Procedure: COLONOSCOPY WITH PROPOFOL;  Surgeon: Scot Jun, MD;  Location: [facility] ENDOSCOPY;  Service: Endoscopy;  Laterality: N/A;   TONSILLECTOMY     VULVA /PERINEUM BIOPSY  1999   WRIST SURGERY Left 12/2015   corrective osteotomy    Family History  Problem Relation Age of Onset   Breast cancer Cousin 29       paternal second cousin   Heart disease Mother    Hypertension Mother    Heart disease Father    Hypertension Father    Colon polyps Father    Ovarian cancer Paternal Grandmother 31    Social History   Socioeconomic History   Marital status: Married  Spouse name: Not on file   Number of children: 2   Years of education: 72   Highest education level: Not on file  Occupational History   Occupation: JOURNALIST  Tobacco Use   Smoking status: Former    Current packs/day: 0.00    Types: Cigarettes    Quit date: 06/19/1995    Years since quitting: 27.9   Smokeless tobacco: Never  Vaping Use   Vaping status: Never Used  Substance and Sexual Activity   Alcohol use: Yes    Comment: occasionally   Drug use: No   Sexual activity: Not Currently    Partners: Male    Birth control/protection: None  Other Topics Concern   Not on file  Social History Narrative   Not on file   Social Drivers of Health   Financial Resource Strain: Low Risk  (04/10/2023)    [facility]   Overall Financial Resource Strain (CARDIA)    Difficulty of Paying Living Expenses: Not hard at all  Food Insecurity: No Food Insecurity (04/10/2023)   Received from [facility]   Hunger Vital Sign    Worried About Running Out of Food in the Last Year: Never true    Ran Out of Food in the Last Year: Never true  Transportation Needs: No Transportation Needs (04/10/2023)   Received from [facility] - Transportation    In the past 12 months, has lack of transportation kept you from medical appointments or from getting medications?: No    Lack of Transportation (Non-Medical): No  Physical Activity: Insufficiently Active (05/17/2017)   Exercise Vital Sign    Days of Exercise per Week: 3 days    Minutes of Exercise per Session: 30 min  Stress: Stress Concern Present (05/17/2017)   [employer] of Occupational Health - Occupational Stress Questionnaire    Feeling of Stress : Rather much  Social Connections: Socially Integrated (05/17/2017)   Social Connection and Isolation Panel [NHANES]    Frequency of Communication with Friends and Family: More than three times a week    Frequency of Social Gatherings with Friends and Family: More than three times a week    Attends Religious Services: More than 4 times per year    Active Member of [employer] or Organizations: Yes    Attends Engineer, structural: More than 4 times per year    Marital Status: Married  Catering manager Violence: Not At Risk (05/17/2017)   Humiliation, Afraid, Rape, and Kick questionnaire    Fear of Current or Ex-Partner: No    Emotionally Abused: No    Physically Abused: No    Sexually Abused: No     Current Outpatient Medications:    acetaminophen (TYLENOL) 325 MG tablet, Take 2 tablets by mouth as needed., Disp: , Rfl:    Cholecalciferol (VITAMIN D3 PO), , Disp: , Rfl:    clobetasol ointment (TEMOVATE) 0.05 %, Apply  to affected area every 1-2 wks for sx control, Disp: 30 g, Rfl: 0   Cyanocobalamin (VITAMIN B 12) 500 MCG TABS, Take 2 tablets by mouth daily., Disp: , Rfl:    ELDERBERRY PO, Take by mouth., Disp: , Rfl:    fluticasone (FLONASE) 50 MCG/ACT nasal spray, Place 1 spray into both nostrils as needed for allergies or rhinitis., Disp: , Rfl:    loratadine (CLARITIN REDITABS) 10 MG dissolvable tablet, Take by mouth., Disp: , Rfl:    Probiotic Product (PROBIOTIC-10) CHEW, Chew  2 tablets by mouth daily., Disp: , Rfl:    triamcinolone cream (KENALOG) 0.1 %, Apply topically 2 (two) times daily. (Patient not taking: Reported on 06/07/2022), Disp: , Rfl:      ROS:  Review of Systems  Constitutional:  Negative for fatigue, fever and unexpected weight change.  Respiratory:  Negative for cough, shortness of breath and wheezing.   Cardiovascular:  Negative for chest pain, palpitations and leg swelling.  Gastrointestinal:  Negative for blood in stool, constipation, diarrhea, nausea and vomiting.  Endocrine: Negative for cold intolerance, heat intolerance and polyuria.  Genitourinary:  Negative for dyspareunia, dysuria, flank pain, frequency, genital sores, hematuria, menstrual problem, pelvic pain, urgency, vaginal bleeding, vaginal discharge and vaginal pain.  Musculoskeletal:  Negative for back pain, joint swelling and myalgias.  Skin:  Negative for rash.  Neurological:  Positive for headaches. Negative for dizziness, syncope, light-headedness and numbness.  Hematological:  Negative for adenopathy.  Psychiatric/Behavioral:  Negative for agitation, confusion, sleep disturbance and suicidal ideas. The patient is not nervous/anxious.    BREAST: No symptoms    Objective: There were no vitals taken for this visit.   Physical Exam Constitutional:      Appearance: She is well-developed.  Genitourinary:     Vulva normal.     Right Labia: rash.     Right Labia: No tenderness or lesions.    Left Labia:  rash.     Left Labia: No tenderness or lesions.    No vaginal discharge, erythema or tenderness.      Right Adnexa: not tender and no mass present.    Left Adnexa: not tender and no mass present.    No cervical friability or polyp.     Uterus is not enlarged or tender.  Breasts:    Right: No mass, nipple discharge, skin change or tenderness.     Left: No mass, nipple discharge, skin change or tenderness.  Neck:     Thyroid: No thyromegaly.  Cardiovascular:     Rate and Rhythm: Normal rate and regular rhythm.     Heart sounds: Normal heart sounds. No murmur heard. Pulmonary:     Effort: Pulmonary effort is normal.     Breath sounds: Normal breath sounds.  Abdominal:     Palpations: Abdomen is soft.     Tenderness: There is no abdominal tenderness. There is no guarding or rebound.  Musculoskeletal:        General: Normal range of motion.     Cervical back: Normal range of motion.  Lymphadenopathy:     Cervical: No cervical adenopathy.  Neurological:     General: No focal deficit present.     Mental Status: She is alert and oriented to person, place, and time.     Cranial Nerves: No cranial nerve deficit.  Skin:    General: Skin is warm and dry.  Psychiatric:        Mood and Affect: Mood normal.        Behavior: Behavior normal.        Thought Content: Thought content normal.        Judgment: Judgment normal.  Vitals reviewed.     Assessment/Plan: Encounter for annual routine gynecological examination  Cervical cancer screening - Plan: Cytology - PAP  Screening for HPV (human papillomavirus) - Plan: Cytology - PAP  Encounter for screening mammogram for malignant neoplasm of breast; pt current on mammo  Family history of ovarian cancer--pt is MyRisk neg; no further screening recommended  Screening for  colon cancer--pt to schedule with GI. Will do ref prn  Lichen sclerosus et atrophicus - Plan: clobetasol ointment (TEMOVATE) 0.05 %   Lichen sclerosus et atrophicus  - Plan: clobetasol ointment (TEMOVATE) 0.05 %; Rx RF; recommended tx QWK or QOWk. Pt aware of risks of SCC if left untreated.  F/u prn.   No orders of the defined types were placed in this encounter.          GYN counsel breast self exam, mammography screening, menopause, adequate intake of calcium and vitamin D, diet and exercise    F/U  No follow-ups on file.  Alla Sloma B. Lydell Moga, PA-C 06/07/2023 2:48 PM

## 2023-06-10 ENCOUNTER — Ambulatory Visit (INDEPENDENT_AMBULATORY_CARE_PROVIDER_SITE_OTHER): Payer: BC Managed Care – PPO | Admitting: Obstetrics and Gynecology

## 2023-06-10 ENCOUNTER — Encounter: Payer: Self-pay | Admitting: Obstetrics and Gynecology

## 2023-06-10 VITALS — BP 135/77 | HR 99 | Ht 64.0 in | Wt 175.0 lb

## 2023-06-10 DIAGNOSIS — L900 Lichen sclerosus et atrophicus: Secondary | ICD-10-CM

## 2023-06-10 DIAGNOSIS — Z01419 Encounter for gynecological examination (general) (routine) without abnormal findings: Secondary | ICD-10-CM | POA: Diagnosis not present

## 2023-06-10 DIAGNOSIS — Z1231 Encounter for screening mammogram for malignant neoplasm of breast: Secondary | ICD-10-CM

## 2023-06-10 DIAGNOSIS — Z1211 Encounter for screening for malignant neoplasm of colon: Secondary | ICD-10-CM

## 2023-06-10 DIAGNOSIS — Z8041 Family history of malignant neoplasm of ovary: Secondary | ICD-10-CM

## 2023-06-10 MED ORDER — CLOBETASOL PROPIONATE 0.05 % EX OINT: TOPICAL_OINTMENT | CUTANEOUS | 0 refills | Status: DC

## 2023-06-10 NOTE — Patient Instructions (Signed)
I value your feedback and you entrusting us with your care. If you get a [facility] patient survey, I would appreciate you taking the time to let us know about your experience today. Thank you!  [facility] ([place])--[phone]  

## 2023-07-23 ENCOUNTER — Ambulatory Visit
Admission: RE | Admit: 2023-07-23 | Discharge: 2023-07-23 | Disposition: A | Discharge status: Home / Self Care | Payer: BC Managed Care – PPO | Source: Ambulatory Visit | Attending: Obstetrics and Gynecology | Admitting: Obstetrics and Gynecology

## 2023-07-23 DIAGNOSIS — Z1231 Encounter for screening mammogram for malignant neoplasm of breast: Secondary | ICD-10-CM | POA: Insufficient documentation

## 2023-07-25 ENCOUNTER — Encounter: Payer: Self-pay | Admitting: Obstetrics and Gynecology

## 2023-07-26 ENCOUNTER — Telehealth: Payer: Self-pay

## 2023-07-26 NOTE — Telephone Encounter (Signed)
 Patient calling in with medication question. She states taking elderberry and melatonin daily along with other vitamins. She has not started the magnesium yet. She is calling to ask about any possible contraindications  with the melatonin. Please advise patient.

## 2023-07-27 NOTE — Telephone Encounter (Signed)
 There aren't any contraindications that I know of to melatonin, elderberry and magnesium taken daily.

## 2023-07-29 NOTE — Telephone Encounter (Signed)
 Called pt, no answer, LVMTRC.

## 2023-07-30 NOTE — Telephone Encounter (Signed)
Depending on the dosage she can buy, 100 mg is usually not enough. Would recommend 300-400 mg (no more than 500 mg).

## 2023-07-31 NOTE — Telephone Encounter (Signed)
Called pt, no answer, LVMTRC.

## 2023-08-02 NOTE — Telephone Encounter (Signed)
Called pt, no answer, LVMTRC.

## 2023-08-02 NOTE — Telephone Encounter (Signed)
Pt called back and is aware

## 2024-04-14 ENCOUNTER — Other Ambulatory Visit: Payer: Self-pay | Admitting: Infectious Diseases

## 2024-04-14 DIAGNOSIS — Z1231 Encounter for screening mammogram for malignant neoplasm of breast: Secondary | ICD-10-CM

## 2024-07-23 ENCOUNTER — Encounter: Payer: Self-pay | Admitting: Obstetrics and Gynecology

## 2024-07-23 ENCOUNTER — Ambulatory Visit
Admission: RE | Admit: 2024-07-23 | Discharge: 2024-07-23 | Disposition: A | Source: Ambulatory Visit | Attending: Infectious Diseases | Admitting: Infectious Diseases

## 2024-07-23 ENCOUNTER — Ambulatory Visit: Admitting: Obstetrics and Gynecology

## 2024-07-23 VITALS — BP 124/89 | HR 88 | Ht 64.0 in | Wt 179.0 lb

## 2024-07-23 DIAGNOSIS — Z1231 Encounter for screening mammogram for malignant neoplasm of breast: Secondary | ICD-10-CM

## 2024-07-23 DIAGNOSIS — Z8041 Family history of malignant neoplasm of ovary: Secondary | ICD-10-CM

## 2024-07-23 DIAGNOSIS — Z01419 Encounter for gynecological examination (general) (routine) without abnormal findings: Secondary | ICD-10-CM

## 2024-07-23 DIAGNOSIS — L900 Lichen sclerosus et atrophicus: Secondary | ICD-10-CM

## 2024-07-23 MED ORDER — CLOBETASOL PROPIONATE 0.05 % EX OINT: TOPICAL_OINTMENT | CUTANEOUS | 0 refills | Status: AC

## 2024-07-23 NOTE — Patient Instructions (Signed)
 I value your feedback and you entrusting [place] with your care. If you get a [facility] patient survey, I would appreciate you taking the time to let us know about your experience today. Thank you! ? ? ?

## 2024-07-23 NOTE — Progress Notes (Signed)
 "  PCP: Epifanio Alm SQUIBB, MD   Chief Complaint  Patient presents with   Gynecologic Exam    Vaginal pain and itching, possible LS flare up?    HPI:      [name] is a 54 y.o. [id number] whose LMP was Patient's last menstrual period was 07/08/2020., presents today for her annual examination.  Her menses are absent due to menopause, no PMB.  Has tolerable VS sx; sleeping better with Mg supp. Too groggy if does melatonin and Mg.   Sex activity: not sexually active due to husband health issue. No vag sx.  Hx of LS, treated with clobetasol  every other wk with sx control in past but stopped tx about 10/25; now with vaginal pain and itching in typical LS places for pt. Has not restarted clobetasol  oint.   Last Pap: 06/07/22  Results were: no abnormalities /neg HPV DNA. Repeat due 12/28 Hx of STDs: HPV on pap;  S/p LEEP 1998  Last mammogram: today, awaiting results; 07/19/22  Results were: normal--routine follow-up in 12 months. S/p stereotactic biopsy on her right breast for suspicious calcifications 2/18;  Returned as complex sclerosing lesion/PASH There is a FH of breast cancer in her pat 2nd cousin. There is a FH of ovarian cancer in her PGM. The patient does self-breast exams. Pt is MyRisk neg except MSH6 VUS 2022; IBIS=12.1%/riskscore=22.0% (? Accurate since 2nd cousin and listed as 1st cousin on MyRisk test req)   Colonoscopy: 2019 and 01/2023 with polyps with [facility];  Repeat due after 5 years.   Tobacco use: The patient denies current or previous tobacco use. Alcohol use: social drinker  No drug use Exercise: occas active  She does get adequate calcium and Vitamin D in her diet.  Labs with PCP.   Past Medical History:  Diagnosis Date   Anemia    BRCA negative 06/2021   MyRisk neg except MSH6 VUS   Cervical dystonia    Cervical high risk HPV (human papillomavirus) test positive 2003   BVD - LEEP   Diverticulitis 2019   Family history of ovarian cancer 05/2017    genetic testing letter sent   History of abnormal mammogram 03/2010; 2017/18   steriotactic bx - [facility]; again 2017/18   Hx of degenerative disc disease    Hypertension    Increased risk of breast cancer 06/2021   IBIS=12.1%/riskscore=22.0%   Lichen sclerosus et atrophicus 2019   Tachycardia 2014   Torticollis 01/2015    Past Surgical History:  Procedure Laterality Date   BREAST BIOPSY Right 2011   Fibrocystic changes with ribbon marker   BREAST BIOPSY Right 2018   stereo with dumbell marker, complex sclerosing lesion, columnar cell change, PASH, sclerosing adenosis, no atypia or malignancy, no excision   CERVICAL BIOPSY  W/ LOOP ELECTRODE EXCISION  12/11/1996   CESAREAN SECTION  2004   CESAREAN SECTION  2002   CHOLECYSTECTOMY  2004   COLONOSCOPY  2007   COLONOSCOPY WITH PROPOFOL  N/A 10/18/2017   Procedure: COLONOSCOPY WITH PROPOFOL ;  Surgeon: Viktoria Lamar DASEN, MD;  Location: [facility] ENDOSCOPY;  Service: Endoscopy;  Laterality: N/A;   TONSILLECTOMY     VULVA /PERINEUM BIOPSY  1999   WRIST SURGERY Left 12/2015   corrective osteotomy    Family History  Problem Relation Age of Onset   Heart disease Mother    Hypertension Mother    Dementia Mother    Heart disease Father    Hypertension Father    Colon polyps  Father    Ovarian cancer Paternal Grandmother 42   Breast cancer Cousin 12       paternal second cousin    Social History   Socioeconomic History   Marital status: Married    Spouse name: Not on file   Number of children: 2   Years of education: 16   Highest education level: Not on file  Occupational History   Occupation: JOURNALIST  Tobacco Use   Smoking status: Former    Current packs/day: 0.00    Types: Cigarettes    Quit date: 06/19/1995    Years since quitting: 29.1   Smokeless tobacco: Never  Vaping Use   Vaping status: Never Used  Substance and Sexual Activity   Alcohol use: Yes    Comment: occasionally   Drug use: No   Sexual activity: Not  Currently    Partners: Male    Birth control/protection: Post-menopausal  Other Topics Concern   Not on file  Social History Narrative   Not on file   Social Drivers of Health   Tobacco Use: Medium Risk (07/23/2024)   Patient History    Smoking Tobacco Use: Former    Smokeless Tobacco Use: Never    Passive Exposure: Not on Actuary Strain: Low Risk  (04/12/2024)   Received from [facility]   Overall Financial Resource Strain (CARDIA)    Difficulty of Paying Living Expenses: Not hard at all  Food Insecurity: No Food Insecurity (04/12/2024)   Received from [facility]   Epic    Within the past 12 months, you worried that your food would run out before you got the money to buy more.: Never true    Within the past 12 months, the food you bought just didn't last and you didn't have money to get more.: Never true  Transportation Needs: No Transportation Needs (04/12/2024)   Received from [facility] - Transportation    In the past 12 months, has lack of transportation kept you from medical appointments or from getting medications?: No    Lack of Transportation (Non-Medical): No  Physical Activity: Not on file  Stress: Not on file  Social Connections: Not on file  Intimate Partner Violence: Not on file  Depression (EYV7-0): Not on file  Alcohol Screen: Not on file  Housing: Low Risk  (04/12/2024)   Received from [facility]   Epic    In the last 12 months, was there a time when you were not able to pay the mortgage or rent on time?: No    In the past 12 months, how many times have you moved where you were living?: 0    At any time in the past 12 months, were you homeless or living in a shelter (including now)?: No  Utilities: Not At Risk (04/12/2024)   Received from [facility]   Epic    In the past 12 months has the electric, gas, oil, or water company threatened to  shut off services in your home?: No  Health Literacy: Not on file     Current Outpatient Medications:    acetaminophen (TYLENOL) 325 MG tablet, Take 2 tablets by mouth as needed., Disp: , Rfl:    Cholecalciferol (VITAMIN D3 PO), , Disp: , Rfl:    Cyanocobalamin (VITAMIN B 12) 500 MCG TABS, Take 2 tablets by mouth daily., Disp: , Rfl:    ELDERBERRY PO, Take by mouth.,  Disp: , Rfl:    fluticasone (FLONASE) 50 MCG/ACT nasal spray, Place 1 spray into both nostrils as needed for allergies or rhinitis., Disp: , Rfl:    IncobotulinumtoxinA (XEOMIN) 200 units SOLR, Inject 200 Units into the muscle., Disp: , Rfl:    loratadine (CLARITIN REDITABS) 10 MG dissolvable tablet, Take by mouth., Disp: , Rfl:    MAGNESIUM GLYCINATE PO, Take by mouth., Disp: , Rfl:    Probiotic Product (PROBIOTIC-10) CHEW, Chew 2 tablets by mouth daily., Disp: , Rfl:    clobetasol  ointment (TEMOVATE ) 0.05 %, Apply to affected area every 1-2 wks for sx control, Disp: 30 g, Rfl: 0     ROS:  Review of Systems  Constitutional:  Negative for fatigue, fever and unexpected weight change.  Respiratory:  Negative for cough, shortness of breath and wheezing.   Cardiovascular:  Negative for chest pain, palpitations and leg swelling.  Gastrointestinal:  Negative for blood in stool, constipation, diarrhea, nausea and vomiting.  Endocrine: Negative for cold intolerance, heat intolerance and polyuria.  Genitourinary:  Positive for vaginal pain. Negative for dyspareunia, dysuria, flank pain, frequency, genital sores, hematuria, menstrual problem, pelvic pain, urgency, vaginal bleeding and vaginal discharge.  Musculoskeletal:  Positive for arthralgias. Negative for back pain, joint swelling and myalgias.  Skin:  Negative for rash.  Neurological:  Negative for dizziness, syncope, light-headedness, numbness and headaches.  Hematological:  Negative for adenopathy.  Psychiatric/Behavioral:  Negative for agitation, confusion, sleep  disturbance and suicidal ideas. The patient is not nervous/anxious.    BREAST: No symptoms    Objective: BP 124/89   Pulse 88   Ht 5' 4 (1.626 m)   Wt 179 lb (81.2 kg)   LMP 07/08/2020   BMI 30.73 kg/m    Physical Exam Constitutional:      Appearance: She is well-developed.  Genitourinary:     Vulva normal.     Right Labia: rash.     Right Labia: No tenderness or lesions.    Left Labia: rash.     Left Labia: No tenderness or lesions.       No vaginal discharge, erythema or tenderness.     Moderate vaginal atrophy present.     Right Adnexa: not tender and no mass present.    Left Adnexa: not tender and no mass present.    No cervical friability or polyp.     Uterus is not enlarged or tender.  Breasts:    Right: No mass, nipple discharge, skin change or tenderness.     Left: No mass, nipple discharge, skin change or tenderness.  Neck:     Thyroid: No thyromegaly.  Cardiovascular:     Rate and Rhythm: Normal rate and regular rhythm.     Heart sounds: Normal heart sounds. No murmur heard. Pulmonary:     Effort: Pulmonary effort is normal.     Breath sounds: Normal breath sounds.  Abdominal:     Palpations: Abdomen is soft.     Tenderness: There is no abdominal tenderness. There is no guarding or rebound.  Musculoskeletal:        General: Normal range of motion.     Cervical back: Normal range of motion.  Lymphadenopathy:     Cervical: No cervical adenopathy.  Neurological:     General: No focal deficit present.     Mental Status: She is alert and oriented to person, place, and time.     Cranial Nerves: No cranial nerve deficit.  Skin:    General: Skin  is warm and dry.  Psychiatric:        Mood and Affect: Mood normal.        Behavior: Behavior normal.        Thought Content: Thought content normal.        Judgment: Judgment normal.  Vitals reviewed.     Assessment/Plan: Encounter for annual routine gynecological examination  Encounter for screening  mammogram for malignant neoplasm of breast; pt current on mammo  Family history of ovarian cancer--pt is MyRisk neg, no increased screening.   Lichen sclerosus et atrophicus - Plan: clobetasol  ointment (TEMOVATE ) 0.05 %; sx flare recently after stopped tx. Rx RF clobetasol , treat at bedtime for 2 wks, then QOHS for 2 wks, then once wkly as maintenance. F/u prn.    Meds ordered this encounter  Medications   clobetasol  ointment (TEMOVATE ) 0.05 %    Sig: Apply to affected area every 1-2 wks for sx control    Dispense:  30 g    Refill:  0    Supervising Provider:   ROBY, MICIA [[id number]]           GYN counsel breast self exam, mammography screening, menopause, adequate intake of calcium and vitamin D, diet and exercise    F/U  Return in about 1 year (around 07/23/2025).  Chelsea Graves B. Rayya Yagi, PA-C 07/23/2024 11:50 AM "
